# Patient Record
Sex: Male | Born: 1980 | Race: Black or African American | Hispanic: No | Marital: Married | Smoking: Current every day smoker
Health system: Southern US, Community
[De-identification: ages and names within clinical notes are randomized; demographics above are authoritative.]

## PROBLEM LIST (undated history)

## (undated) DIAGNOSIS — B20 Human immunodeficiency virus [HIV] disease: Secondary | ICD-10-CM

## (undated) DIAGNOSIS — E113499 Type 2 diabetes mellitus with severe nonproliferative diabetic retinopathy without macular edema, unspecified eye: Secondary | ICD-10-CM

## (undated) DIAGNOSIS — E785 Hyperlipidemia, unspecified: Secondary | ICD-10-CM

## (undated) DIAGNOSIS — K529 Noninfective gastroenteritis and colitis, unspecified: Secondary | ICD-10-CM

## (undated) DIAGNOSIS — Z21 Asymptomatic human immunodeficiency virus [HIV] infection status: Secondary | ICD-10-CM

## (undated) HISTORY — DX: Type 2 diabetes mellitus with severe nonproliferative diabetic retinopathy without macular edema, unspecified eye: E11.3499

## (undated) HISTORY — PX: TONSILLECTOMY: SUR1361

## (undated) HISTORY — DX: Noninfective gastroenteritis and colitis, unspecified: K52.9

## (undated) HISTORY — DX: Hyperlipidemia, unspecified: E78.5

## (undated) HISTORY — PX: GANGLION CYST EXCISION: SHX1691

---

## 2014-09-29 DIAGNOSIS — Z8371 Family history of colonic polyps: Secondary | ICD-10-CM | POA: Insufficient documentation

## 2014-11-07 DIAGNOSIS — K529 Noninfective gastroenteritis and colitis, unspecified: Secondary | ICD-10-CM | POA: Insufficient documentation

## 2019-07-18 ENCOUNTER — Telehealth: Payer: Self-pay | Admitting: Adult Health

## 2019-07-18 ENCOUNTER — Ambulatory Visit (INDEPENDENT_AMBULATORY_CARE_PROVIDER_SITE_OTHER): Payer: BC Managed Care – PPO | Admitting: Adult Health

## 2019-07-18 ENCOUNTER — Other Ambulatory Visit: Payer: Self-pay

## 2019-07-18 ENCOUNTER — Encounter: Payer: Self-pay | Admitting: Adult Health

## 2019-07-18 VITALS — BP 128/92 | HR 75 | Ht 68.0 in | Wt 165.0 lb

## 2019-07-18 DIAGNOSIS — F329 Major depressive disorder, single episode, unspecified: Secondary | ICD-10-CM | POA: Insufficient documentation

## 2019-07-18 DIAGNOSIS — F429 Obsessive-compulsive disorder, unspecified: Secondary | ICD-10-CM

## 2019-07-18 DIAGNOSIS — F411 Generalized anxiety disorder: Secondary | ICD-10-CM

## 2019-07-18 DIAGNOSIS — F4310 Post-traumatic stress disorder, unspecified: Secondary | ICD-10-CM | POA: Insufficient documentation

## 2019-07-18 DIAGNOSIS — F339 Major depressive disorder, recurrent, unspecified: Secondary | ICD-10-CM | POA: Diagnosis not present

## 2019-07-18 DIAGNOSIS — B20 Human immunodeficiency virus [HIV] disease: Secondary | ICD-10-CM | POA: Insufficient documentation

## 2019-07-18 MED ORDER — ESCITALOPRAM OXALATE 10 MG PO TABS: 10.0000 mg | ORAL_TABLET | Freq: Every day | ORAL | 2 refills | Status: DC

## 2019-07-18 NOTE — Telephone Encounter (Signed)
RX sent to [facility] [address]. Updated preferred pharmacy

## 2019-07-18 NOTE — Telephone Encounter (Signed)
Patient called back to change his pharmacy [facility] [address]. For his lexapro 10 mg

## 2019-07-18 NOTE — Telephone Encounter (Signed)
Pt called to ask [facility] [place] [facility] [place] location. This means change Lexapro Rx to new location also. Gave you the wrong location.

## 2019-07-18 NOTE — Progress Notes (Signed)
[facility] MD/PA/NP Initial Note  07/18/2019 12:04 PM Justin [name]  MRN:  [id number]  Chief Complaint:   HPI:   Referred by Psychologist - Jeanella Anton.   Describes mood today as "ok". Pleasant. Mood symptoms - reports depression, anxiety, and irritability at times. Gets wrapped up in the "what if's". Gets irritated when people are too close to me. Doesn't like getting alerts on phone. More "short tempered". Fiance will say "I don't like your attitude". Not being "nice to mom "sometimes". Stating the biggest thing is "irritaility". Has depressive "episodes". Feels like life is good and going well - "but I feel blah". Feels like a burden at times. Feels like therapy has been helpful. Has done an affirmation box. Has a good life - "fiance, adopting child, nice home". Notes impulsivity at times  - - buying things, increased sexual activity, drinking too much". Trying to get better about avoiding feelings. Stable interest and motivation. Taking medications as prescribed.  Energy levels Active, does not have a regular exercise routine. Works full-time as Therapist, occupational. Is about to be furlowed October first. Has some back up plans.  Enjoys some usual interests and activities. Spending time with fiance - together 4 years - getting married next September. Family lives in [place]. Fiance's mother local.  Appetite adequate. Weight fluctuates - "over eats and then may have one meal a day". Sleeps better some nights than others. Averages 5 to 6 hours. Napping during the day. Mind races. Good sleep hygiene.  Focus and concentration stable - "it's ok". Completing tasks. Managing aspects of household. Is a multi-tasker starting tasks and not finishing things. Eventually does get things done.  Denies SI or HI. Denies AH or VH.  Previous medications: Lexapro, Celexa - SSE - 1 and 1/2 years.   Visit Diagnosis:    ICD-10-CM   1. Obsessive-compulsive disorder, unspecified type  F42.9   2. GAD  (generalized anxiety disorder)  F41.1 escitalopram (LEXAPRO) 10 MG tablet  3. Recurrent major depressive disorder, remission status unspecified (HCC)  F33.9 escitalopram (LEXAPRO) 10 MG tablet    Past Psychiatric History: Suicide attempt 02-01-2010 - living in [place] - overdose on vicodin and Celexa. Had been without ETOH for 30 days that day and had went to a party and drank a lot and smoke weed that may have been "laced" with something. Was in ICU and eventually transferred to Psych unit.   Past Medical History: History reviewed. No pertinent past medical history. History reviewed. No pertinent surgical history.  Family Psychiatric History: Mother depressed x 3 years ago after hs father passed away.   Family History: History reviewed. No pertinent family history.  Social History:  Social History   Socioeconomic History  . Marital status: Unknown    Spouse name: Not on file  . Number of children: Not on file  . Years of education: Not on file  . Highest education level: Not on file  Occupational History  . Not on file  Social Needs  . Financial resource strain: Not on file  . Food insecurity    Worry: Not on file    Inability: Not on file  . Transportation needs    Medical: Not on file    Non-medical: Not on file  Tobacco Use  . Smoking status: Never Smoker  . Smokeless tobacco: Never Used  Substance and Sexual Activity  . Alcohol use: Not on file  . Drug use: Not on file  . Sexual activity: Not on file  Lifestyle  .  Physical activity    Days per week: Not on file    Minutes per session: Not on file  . Stress: Not on file  Relationships  . Social Musician on phone: Not on file    Gets together: Not on file    Attends religious service: Not on file    Active member of club or organization: Not on file    Attends meetings of clubs or organizations: Not on file    Relationship status: Not on file  Other Topics Concern  . Not on file  Social History  Narrative  . Not on file    Allergies: No Known Allergies  Metabolic Disorder Labs: No results found for: HGBA1C, MPG No results found for: PROLACTIN No results found for: CHOL, TRIG, HDL, CHOLHDL, VLDL, LDLCALC No results found for: TSH  Therapeutic Level Labs: No results found for: LITHIUM No results found for: VALPROATE No components found for:  CBMZ  Current Medications: Current Outpatient Medications  Medication Sig Dispense Refill  . escitalopram (LEXAPRO) 10 MG tablet Take 1 tablet (10 mg total) by mouth daily. 30 tablet 2  . hydrocortisone 2.5 % cream     . neomycin-polymyxin b-dexamethasone (MAXITROL) 3.5-10000-0.1 SUSP     . TRIUMEQ 600-50-300 MG tablet      No current facility-administered medications for this visit.     Medication Side Effects: none  Orders placed this visit:  No orders of the defined types were placed in this encounter.   Psychiatric Specialty Exam:  ROS  Blood pressure (!) 128/92, pulse 75, height 5\' 8"  (1.727 m), weight 165 lb (74.8 kg).Body mass index is 25.09 kg/m.  General Appearance: Neat and Well Groomed  Eye Contact:  Good  Speech:  Normal Rate and Pressured  Volume:  Normal  Mood:  Anxious, Depressed and Irritable  Affect:  Congruent  Thought Process:  Coherent  Orientation:  Full (Time, Place, and Person)  Thought Content: Logical   Suicidal Thoughts:  No  Homicidal Thoughts:  No  Memory:  WNL  Judgement:  Good  Insight:  Good  Psychomotor Activity:  Normal  Concentration:  Concentration: Good  Recall:  Good  Fund of Knowledge: Good  Language: Good  Assets:  Communication Skills Desire for Improvement Financial Resources/Insurance Housing Intimacy Leisure Time Physical Health Resilience Social Support Talents/Skills Transportation Vocational/Educational  ADL's:  Intact  Cognition: WNL  Prognosis:  Good   Screenings: None  Receiving Psychotherapy: Yes   Treatment Plan/Recommendations:   Plan:  1.  Add Lexapro 10mg  daily  RTC 4 weeks  Patient advised to contact office with any questions, adverse effects, or acute worsening in signs and symptoms.     , NP

## 2019-08-15 ENCOUNTER — Ambulatory Visit (INDEPENDENT_AMBULATORY_CARE_PROVIDER_SITE_OTHER): Payer: BC Managed Care – PPO | Admitting: Adult Health

## 2019-08-15 ENCOUNTER — Encounter: Payer: Self-pay | Admitting: Adult Health

## 2019-08-15 ENCOUNTER — Other Ambulatory Visit: Payer: Self-pay

## 2019-08-15 DIAGNOSIS — G4700 Insomnia, unspecified: Secondary | ICD-10-CM

## 2019-08-15 DIAGNOSIS — F339 Major depressive disorder, recurrent, unspecified: Secondary | ICD-10-CM

## 2019-08-15 DIAGNOSIS — F411 Generalized anxiety disorder: Secondary | ICD-10-CM

## 2019-08-15 MED ORDER — ESCITALOPRAM OXALATE 20 MG PO TABS: 20.0000 mg | ORAL_TABLET | Freq: Every day | ORAL | 2 refills | Status: DC

## 2019-08-15 MED ORDER — HYDROXYZINE HCL 25 MG PO TABS: ORAL_TABLET | ORAL | 2 refills | Status: DC

## 2019-08-15 NOTE — Progress Notes (Signed)
[facility] MD/PA/NP Medication Check  08/15/2019 10:41 AM Justin [name]  MRN:  [id number]  Chief Complaint:   HPI:   Referred by Psychologist - Jeanella Anton.   Describes mood today as "ok". Pleasant. Mood symptoms - reports decreased depression, anxiety, and irritability at times. Not "ruminating as much". More irritable "overall". Has started reading a new book "The subtle art of not giving a "F" - is working on "letting go of things that are not important". Mother with recent fall - "has been worried about her". Got a call from [facility] - "can't go through with adoption now". He and fiance doing well. Stable interest and motivation. Taking medications as prescribed.  Energy levels stable. Active, does not have a regular exercise routine. 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 Enjoys some usual interests and activities. Spending time with fiance. Family lives in [place] - recently visited mother. Fiance's mother local.  Appetite adequate. Weight fluctuates. Having difficulties with sleep. Averages 5 to 6 hours. Not able to get to sleep. Lying in bed "thinking" - not able to shut mind off.  Focus and concentration stable. Completing tasks. Managing aspects of household.  Denies SI or HI. Denies AH or VH.  Previous medications: Lexapro, Celexa - SSE - 1 and 1/2 years.   Visit Diagnosis:    ICD-10-CM   1. GAD (generalized anxiety disorder)  F41.1 hydrOXYzine (ATARAX/VISTARIL) 25 MG tablet    escitalopram (LEXAPRO) 20 MG tablet  2. Recurrent major depressive disorder, remission status unspecified (HCC)  F33.9 escitalopram (LEXAPRO) 20 MG tablet  3. Insomnia, unspecified type  G47.00 hydrOXYzine (ATARAX/VISTARIL) 25 MG tablet    Past Psychiatric History: Suicide attempt 02-01-2010 - living in [place] - overdose on vicodin and Celexa. Had been without ETOH for 30 days that day and had went to a party and drank a lot and smoke weed that may have been "laced"  with something. Was in ICU and eventually transferred to Psych unit.   Past Medical History: No past medical history on file. No past surgical history on file.  Family Psychiatric History: Mother depressed x 3 years ago after hs father passed away.   Family History: No family history on file.  Social History:  Social History   Socioeconomic History  . Marital status: Unknown    Spouse name: Not on file  . Number of children: Not on file  . Years of education: Not on file  . Highest education level: Not on file  Occupational History  . Not on file  Social Needs  . Financial resource strain: Not on file  . Food insecurity    Worry: Not on file    Inability: Not on file  . Transportation needs    Medical: Not on file    Non-medical: Not on file  Tobacco Use  . Smoking status: Never Smoker  . Smokeless tobacco: Never Used  Substance and Sexual Activity  . Alcohol use: Not on file  . Drug use: Not on file  . Sexual activity: Not on file  Lifestyle  . Physical activity    Days per week: Not on file    Minutes per session: Not on file  . Stress: Not on file  Relationships  . Social Herbalist on phone: Not on file    Gets together: Not on file    Attends religious service: Not on file    Active member of club or organization: Not on file  Attends meetings of clubs or organizations: Not on file    Relationship status: Not on file  Other Topics Concern  . Not on file  Social History Narrative  . Not on file    Allergies: No Known Allergies  Metabolic Disorder Labs: No results found for: HGBA1C, MPG No results found for: PROLACTIN No results found for: CHOL, TRIG, HDL, CHOLHDL, VLDL, LDLCALC No results found for: TSH  Therapeutic Level Labs: No results found for: LITHIUM No results found for: VALPROATE No components found for:  CBMZ  Current Medications: Current Outpatient Medications  Medication Sig Dispense Refill  . escitalopram (LEXAPRO) 20  MG tablet Take 1 tablet (20 mg total) by mouth daily. 30 tablet 2  . hydrocortisone 2.5 % cream     . hydrOXYzine (ATARAX/VISTARIL) 25 MG tablet Take 1/2 to one tablet twice during the day, and 1 to 2 tabs at hs as needed for sleep. 120 tablet 2  . neomycin-polymyxin b-dexamethasone (MAXITROL) 3.5-10000-0.1 SUSP     . TRIUMEQ 600-50-300 MG tablet      No current facility-administered medications for this visit.     Medication Side Effects: none  Orders placed this visit:  No orders of the defined types were placed in this encounter.   Psychiatric Specialty Exam:  Review of Systems  Neurological: Negative for tremors and weakness.    There were no vitals taken for this visit.There is no height or weight on file to calculate BMI.  General Appearance: Neat and Well Groomed  Eye Contact:  Good  Speech:  Normal Rate and Pressured  Volume:  Normal  Mood:  Anxious, Depressed and Irritable  Affect:  Congruent  Thought Process:  Coherent  Orientation:  Full (Time, Place, and Person)  Thought Content: Logical   Suicidal Thoughts:  No  Homicidal Thoughts:  No  Memory:  WNL  Judgement:  Good  Insight:  Good  Psychomotor Activity:  Normal  Concentration:  Concentration: Good  Recall:  Good  Fund of Knowledge: Good  Language: Good  Assets:  Communication Skills Desire for Improvement Financial Resources/Insurance Housing Intimacy Leisure Time Physical Health Resilience Social Support Talents/Skills Transportation Vocational/Educational  ADL's:  Intact  Cognition: WNL  Prognosis:  Good   Screenings: None  Receiving Psychotherapy: Yes   Treatment Plan/Recommendations:   Plan:  1. Increase Lexapro 10mg  daily 2. Add Vistrail 25mg  tablet - 1 to 2 during the day and 1 to 2 at bedtime.   RTC 4 weeks  Patient advised to contact office with any questions, adverse effects, or acute worsening in signs and symptoms.     , NP

## 2019-09-12 ENCOUNTER — Ambulatory Visit (INDEPENDENT_AMBULATORY_CARE_PROVIDER_SITE_OTHER): Payer: BC Managed Care – PPO | Admitting: Adult Health

## 2019-09-12 ENCOUNTER — Encounter: Payer: Self-pay | Admitting: Adult Health

## 2019-09-12 ENCOUNTER — Other Ambulatory Visit: Payer: Self-pay

## 2019-09-12 DIAGNOSIS — F411 Generalized anxiety disorder: Secondary | ICD-10-CM | POA: Diagnosis not present

## 2019-09-12 DIAGNOSIS — G4700 Insomnia, unspecified: Secondary | ICD-10-CM

## 2019-09-12 DIAGNOSIS — F339 Major depressive disorder, recurrent, unspecified: Secondary | ICD-10-CM | POA: Diagnosis not present

## 2019-09-12 DIAGNOSIS — F429 Obsessive-compulsive disorder, unspecified: Secondary | ICD-10-CM | POA: Diagnosis not present

## 2019-09-12 MED ORDER — ESCITALOPRAM OXALATE 20 MG PO TABS: 20.0000 mg | ORAL_TABLET | Freq: Every day | ORAL | 2 refills | Status: DC

## 2019-09-12 NOTE — Progress Notes (Addendum)
[facility] MD/PA/NP Medication Check  09/12/2019 10:30 AM Justin [name]  MRN:  [id number]   Virtual Visit via Telephone Note  I connected with pt on 09/12/19 at  10:00 AM EDT by telephone and verified that I am speaking with the correct person using two identifiers.   I discussed the limitations, risks, security and privacy concerns of performing an evaluation and management service by telephone and the availability of in person appointments. I also discussed with the patient that there may be a patient responsible charge related to this service. The patient expressed understanding and agreed to proceed.   I discussed the assessment and treatment plan with the patient. The patient was provided an opportunity to ask questions and all were answered. The patient agreed with the plan and demonstrated an understanding of the instructions.   The patient was advised to call back or seek an in-person evaluation if the symptoms worsen or if the condition fails to improve as anticipated.  I provided 30 minutes of non-face-to-face time during this encounter.  The patient was located at home.  The provider was located at [place].  Chief Complaint:   HPI:   Referred by Psychologist - Jeanella Anton.   Describes mood today as "ok". Pleasant. Mood symptoms - depression, anxiety, and irritability. Decreased worry and rumination. Is able to let things go easier. He and fiance doing well. Mother recovering well. Stable interest and motivation. Taking medications as prescribed. Stable interest and motivation. Taking medications as prescribed.  Energy levels stable. Active, does not have a regular exercise routine. Starting a new job with [employer]. Will be working from home initially.  Enjoys some usual interests and activities. Engaged. Lives with fiance. Will be local for Thanksgiving (fiance family local) - [place]. Visiting mother in [place].  Appetite adequate. Weight  fluctuates. Sleep has improved. Averages 6 to 8 hours.  Focus and concentration stable. Completing tasks. Managing aspects of household.  Denies SI or HI. Denies AH or VH.  Previous medications: Lexapro, Celexa - SSE - 1 and 1/2 years.   Visit Diagnosis:    ICD-10-CM   1. Obsessive-compulsive disorder, unspecified type  F42.9   2. Insomnia, unspecified type  G47.00   3. Recurrent major depressive disorder, remission status unspecified ([facility])  F33.9   4. GAD (generalized anxiety disorder)  F41.1     Past Psychiatric History: Suicide attempt 02-01-2010 - living in [place] - overdose on vicodin and Celexa. Had been without ETOH for 30 days that day and had went to a party and drank a lot and smoke weed that may have been "laced" with something. Was in ICU and eventually transferred to Psych unit.   Past Medical History: No past medical history on file. No past surgical history on file.  Family Psychiatric History: Mother depressed x 3 years ago after hs father passed away.   Family History: No family history on file.  Social History:  Social History   Socioeconomic History  . Marital status: Unknown    Spouse name: Not on file  . Number of children: Not on file  . Years of education: Not on file  . Highest education level: Not on file  Occupational History  . Not on file  Social Needs  . Financial resource strain: Not on file  . Food insecurity    Worry: Not on file    Inability: Not on file  . Transportation needs    Medical: Not on file    Non-medical: Not on  file  Tobacco Use  . Smoking status: Never Smoker  . Smokeless tobacco: Never Used  Substance and Sexual Activity  . Alcohol use: Not on file  . Drug use: Not on file  . Sexual activity: Not on file  Lifestyle  . Physical activity    Days per week: Not on file    Minutes per session: Not on file  . Stress: Not on file  Relationships  . Social Musician on phone: Not on file    Gets together:  Not on file    Attends religious service: Not on file    Active member of club or organization: Not on file    Attends meetings of clubs or organizations: Not on file    Relationship status: Not on file  Other Topics Concern  . Not on file  Social History Narrative  . Not on file    Allergies: No Known Allergies  Metabolic Disorder Labs: No results found for: HGBA1C, MPG No results found for: PROLACTIN No results found for: CHOL, TRIG, HDL, CHOLHDL, VLDL, LDLCALC No results found for: TSH  Therapeutic Level Labs: No results found for: LITHIUM No results found for: VALPROATE No components found for:  CBMZ  Current Medications: Current Outpatient Medications  Medication Sig Dispense Refill  . escitalopram (LEXAPRO) 20 MG tablet Take 1 tablet (20 mg total) by mouth daily. 30 tablet 2  . hydrocortisone 2.5 % cream     . hydrOXYzine (ATARAX/VISTARIL) 25 MG tablet Take 1/2 to one tablet twice during the day, and 1 to 2 tabs at hs as needed for sleep. 120 tablet 2  . neomycin-polymyxin b-dexamethasone (MAXITROL) 3.5-10000-0.1 SUSP     . TRIUMEQ 600-50-300 MG tablet      No current facility-administered medications for this visit.     Medication Side Effects: none  Orders placed this visit:  No orders of the defined types were placed in this encounter.   Psychiatric Specialty Exam:  Review of Systems  Musculoskeletal: Negative for falls.  Neurological: Negative for tremors and weakness.  Psychiatric/Behavioral: The patient does not have insomnia.   All other systems reviewed and are negative.   There were no vitals taken for this visit.There is no height or weight on file to calculate BMI.  General Appearance: Neat and Well Groomed  Eye Contact:  Good  Speech:  Normal Rate and Pressured  Volume:  Normal  Mood:  Anxious, Depressed and Irritable  Affect:  Congruent  Thought Process:  Coherent and Descriptions of Associations: Intact  Orientation:  Full (Time, Place, and  Person)  Thought Content: Logical   Suicidal Thoughts:  No  Homicidal Thoughts:  No  Memory:  WNL  Judgement:  Good  Insight:  Good  Psychomotor Activity:  Normal  Concentration:  Concentration: Good  Recall:  Good  Fund of Knowledge: Good  Language: Good  Assets:  Communication Skills Desire for Improvement Financial Resources/Insurance Housing Intimacy Leisure Time Physical Health Resilience Social Support Talents/Skills Transportation Vocational/Educational  ADL's:  Intact  Cognition: WNL  Prognosis:  Good   Screenings: None  Receiving Psychotherapy: Yes   Treatment Plan/Recommendations:   Plan:  1. Lexapro 20mg  daily 2. Continue Vistrail 25mg  tablet - 1 to 2 during the day and 1 to 2 at bedtime.   RTC 4 weeks  Patient advised to contact office with any questions, adverse effects, or acute worsening in signs and symptoms.     , NP

## 2019-11-14 ENCOUNTER — Other Ambulatory Visit: Payer: Self-pay

## 2019-11-14 ENCOUNTER — Emergency Department
Admission: EM | Admit: 2019-11-14 | Discharge: 2019-11-14 | Disposition: A | Discharge status: Home / Self Care | Payer: Worker's Compensation | Attending: Student in an Organized Health Care Education/Training Program | Admitting: Student in an Organized Health Care Education/Training Program

## 2019-11-14 ENCOUNTER — Encounter: Payer: Self-pay | Admitting: Emergency Medicine

## 2019-11-14 DIAGNOSIS — S61412A Laceration without foreign body of left hand, initial encounter: Secondary | ICD-10-CM | POA: Diagnosis not present

## 2019-11-14 DIAGNOSIS — Z23 Encounter for immunization: Secondary | ICD-10-CM | POA: Insufficient documentation

## 2019-11-14 DIAGNOSIS — W278XXA Contact with other nonpowered hand tool, initial encounter: Secondary | ICD-10-CM | POA: Diagnosis not present

## 2019-11-14 DIAGNOSIS — Y929 Unspecified place or not applicable: Secondary | ICD-10-CM | POA: Diagnosis not present

## 2019-11-14 DIAGNOSIS — Y9389 Activity, other specified: Secondary | ICD-10-CM | POA: Insufficient documentation

## 2019-11-14 DIAGNOSIS — Y990 Civilian activity done for income or pay: Secondary | ICD-10-CM | POA: Insufficient documentation

## 2019-11-14 HISTORY — DX: Asymptomatic human immunodeficiency virus (hiv) infection status: Z21

## 2019-11-14 HISTORY — DX: Human immunodeficiency virus (HIV) disease: B20

## 2019-11-14 MED ORDER — LIDOCAINE HCL (PF) 1 % IJ SOLN
INTRAMUSCULAR | Status: AC
  Administered 2019-11-14: 5 mL
  Filled 2019-11-14: qty 5

## 2019-11-14 MED ORDER — TETANUS-DIPHTH-ACELL PERTUSSIS 5-2.5-18.5 LF-MCG/0.5 IM SUSP
0.5000 mL | Freq: Once | INTRAMUSCULAR | Status: AC
  Administered 2019-11-14: 07:00:00 0.5 mL via INTRAMUSCULAR
  Filled 2019-11-14: qty 0.5

## 2019-11-14 MED ORDER — LIDOCAINE HCL (PF) 1 % IJ SOLN: 5.0000 mL | Freq: Once | INTRAMUSCULAR | Status: AC

## 2019-11-14 NOTE — ED Triage Notes (Signed)
Patient ambulatory to triage with steady gait, without difficulty or distress noted, mask in place; pt reports employed at [employer]); st while at work PTA was cutting open a box and box cutter slipped causing laceration along left thumb; workers comp profile indicates UDS required

## 2019-11-14 NOTE — ED Notes (Signed)
This tech completed UDS per [employer]. Pts urine specimen was collected and walked to lab by this tech at this time.

## 2019-11-14 NOTE — Discharge Instructions (Signed)
Follow-up with the company nurse, urgent care or return to the emergency department in 10 to 12 days to have sutures removed.  Keep area clean and dry.  Watch for any signs of infection such as pus or redness.  You may take Tylenol as needed for pain.  Clean daily with mild soap and water.  Allow this to dry completely before placing a bandage on it.  Allow area to get lots of air when you are at home.  Cover when you are working.

## 2019-11-14 NOTE — ED Notes (Signed)
See triage note  Presents with laceration to left thumb area  States he was using a box cutter    Slipped  Laceration noted across thumb

## 2019-11-14 NOTE — ED Provider Notes (Signed)
[facility] Emergency Department Provider Note   ____________________________________________   First MD Initiated Contact with Patient 11/14/19 0710     (approximate)  I have reviewed the triage vital signs and the nursing notes.   HISTORY  Chief Complaint Laceration   HPI Justin [name] is a 39 y.o. male presents to the ED with laceration to left hand.  Patient states that he was using a box cutter at work when the laceration occurred.  Patient is uncertain of his last tetanus immunization.  He rates his pain as a 0/10.       Past Medical History:  Diagnosis Date  . HIV (human immunodeficiency virus infection) [facility])     Patient Active Problem List   Diagnosis Date Noted  . HIV disease (HCC) 07/18/2019  . GAD (generalized anxiety disorder) 07/18/2019  . MDD (major depressive disorder) 07/18/2019    Past Surgical History:  Procedure Laterality Date  . GANGLION CYST EXCISION Right   . TONSILLECTOMY      Prior to Admission medications   Medication Sig Start Date End Date Taking? Authorizing Provider  escitalopram (LEXAPRO) 20 MG tablet Take 1 tablet (20 mg total) by mouth daily. 09/12/19   Mozingo, Thereasa Solo, NP  neomycin-polymyxin b-dexamethasone (MAXITROL) 3.5-10000-0.1 SUSP  07/11/19   [provider]  TRIUMEQ 600-50-300 MG tablet  07/11/19   [provider]    Allergies Patient has no known allergies.  No family history on file.  Social History Social History   Tobacco Use  . Smoking status: Never Smoker  . Smokeless tobacco: Never Used  Substance Use Topics  . Alcohol use: Not on file  . Drug use: Not on file    Review of Systems Constitutional: No fever/chills Cardiovascular: Denies chest pain. Respiratory: Denies shortness of breath. Musculoskeletal: Left hand pain. Skin: Left hand laceration. Neurological: Negative for  focal weakness or  numbness. ___________________________________________   PHYSICAL EXAM:  VITAL SIGNS: ED Triage Vitals [11/14/19 0549]  Enc Vitals Group     BP 133/79     Pulse Rate 67     Resp 18     Temp 98.1 F (36.7 C)     Temp Source Oral     SpO2 99 %     Weight 166 lb (75.3 kg)     Height 5\' 8"  (1.727 m)     Head Circumference      Peak Flow      Pain Score 0     Pain Loc      Pain Edu?      Excl. in [place]?     Constitutional: Alert and oriented. Well appearing and in no acute distress. Eyes: Conjunctivae are normal.  Head: Atraumatic. Neck: No stridor.   Cardiovascular: Normal rate, regular rhythm. Grossly normal heart sounds.  Good peripheral circulation. Respiratory: Normal respiratory effort.  No retractions. Lungs CTAB. Musculoskeletal: On examination of the left hand there is no gross deformity however there is a linear laceration on the lateral aspect of the first metacarpal area.  Laceration is superficial and without active bleeding.  Patient is able to flex and extend his thumb completely without any difficulty motor sensory function intact.  Capillary refills less than 3 seconds.  No foreign bodies noted. Neurologic:  Normal speech and language. No gross focal neurologic deficits are appreciated.  Skin:  Skin is warm, dry.  Laceration as noted above. Psychiatric: Mood and affect are normal. Speech and behavior are normal.  ____________________________________________  LABS (all labs ordered are listed, but only abnormal results are displayed)  Labs Reviewed - No data to display   PROCEDURES  Procedure(s) performed (including Critical Care):  Marland KitchenMarland KitchenLaceration Repair  Date/Time: 11/14/2019 7:37 AM Performed by: Johnn Hai, PA-C Authorized by: Johnn Hai, PA-C   Consent:    Consent obtained:  Verbal   Consent given by:  Patient   Risks discussed:  Poor wound healing, pain and infection Anesthesia (see MAR for exact dosages):    Anesthesia method:   Local infiltration   Local anesthetic:  Lidocaine 1% w/o epi Laceration details:    Location:  Hand   Length (cm):  4 Repair type:    Repair type:  Simple Pre-procedure details:    Preparation:  Patient was prepped and draped in usual sterile fashion Exploration:    Hemostasis achieved with:  Direct pressure   Wound exploration: wound explored through full range of motion and entire depth of wound probed and visualized     Contaminated: no   Treatment:    Area cleansed with:  Betadine and saline   Amount of cleaning:  Standard   Irrigation solution:  Sterile saline   Irrigation volume:  20 cc   Irrigation method:  Syringe   Visualized foreign bodies/material removed: no   Skin repair:    Repair method:  Sutures   Suture size:  4-0   Suture material:  Nylon   Suture technique:  Simple interrupted   Number of sutures:  7 Approximation:    Approximation:  Close Post-procedure details:    Dressing:  Non-adherent dressing   Patient tolerance of procedure:  Tolerated well, no immediate complications     ____________________________________________   INITIAL IMPRESSION / ASSESSMENT AND PLAN / ED COURSE  As part of my medical decision making, I reviewed the following data within the electronic MEDICAL RECORD NUMBER Notes from prior ED visits and [place] Controlled Substance Database  39 year old male presents to the ED with laceration to his left hand that occurred while he was at work.  Patient cut himself with a box cutter earlier this morning.  Patient tetanus was updated as he was unsure when the last time he had one.  Patient has range of motion without any restrictions.  Motor sensory function intact.  Laceration was superficial and bleeding was controlled.  He tolerated suturing without any difficulty.  Patient was made aware that the sutures would need to be removed in approximately 10 to 12 days.  He is given instructions on how to take care of this.  Also a note was written for  Workmen's Comp. letting them know that he would also need to keep this clean and dry.  ____________________________________________   FINAL CLINICAL IMPRESSION(S) / ED DIAGNOSES  Final diagnoses:  Laceration of left hand without foreign body, initial encounter     ED Discharge Orders    None       Note:  This document was prepared using Dragon voice recognition software and may include unintentional dictation errors.    Johnn Hai, PA-C 11/14/19 1117    Merlyn Lot, MD 11/14/19 1218

## 2019-11-27 ENCOUNTER — Other Ambulatory Visit: Payer: Self-pay

## 2019-11-27 ENCOUNTER — Emergency Department
Admission: EM | Admit: 2019-11-27 | Discharge: 2019-11-27 | Disposition: A | Discharge status: Home / Self Care | Payer: Worker's Compensation | Attending: Emergency Medicine | Admitting: Emergency Medicine

## 2019-11-27 ENCOUNTER — Encounter: Payer: Self-pay | Admitting: Emergency Medicine

## 2019-11-27 ENCOUNTER — Other Ambulatory Visit: Payer: Self-pay | Admitting: Adult Health

## 2019-11-27 DIAGNOSIS — G4700 Insomnia, unspecified: Secondary | ICD-10-CM

## 2019-11-27 DIAGNOSIS — Z4802 Encounter for removal of sutures: Secondary | ICD-10-CM | POA: Diagnosis present

## 2019-11-27 DIAGNOSIS — B20 Human immunodeficiency virus [HIV] disease: Secondary | ICD-10-CM | POA: Diagnosis not present

## 2019-11-27 DIAGNOSIS — S61412D Laceration without foreign body of left hand, subsequent encounter: Secondary | ICD-10-CM | POA: Insufficient documentation

## 2019-11-27 DIAGNOSIS — F411 Generalized anxiety disorder: Secondary | ICD-10-CM

## 2019-11-27 DIAGNOSIS — W268XXD Contact with other sharp object(s), not elsewhere classified, subsequent encounter: Secondary | ICD-10-CM | POA: Diagnosis not present

## 2019-11-27 DIAGNOSIS — Z79899 Other long term (current) drug therapy: Secondary | ICD-10-CM | POA: Insufficient documentation

## 2019-11-27 NOTE — ED Provider Notes (Signed)
[facility] Emergency Department Provider Note  ____________________________________________   First MD Initiated Contact with Patient 11/27/19 1724     (approximate)  I have reviewed the triage vital signs and the nursing notes.   HISTORY  Chief Complaint Suture / Staple Removal   HPI Justin [name] is a 39 y.o. male presents to have sutures removed.  Patient was seen in the ED for laceration to his left hand when he was using a box cutter at work on 11/14/2019.  Patient denies any problems, drainage or redness.     Past Medical History:  Diagnosis Date  . HIV (human immunodeficiency virus infection) [facility])     Patient Active Problem List   Diagnosis Date Noted  . HIV disease ([place]) 07/18/2019  . GAD (generalized anxiety disorder) 07/18/2019  . MDD (major depressive disorder) 07/18/2019    Past Surgical History:  Procedure Laterality Date  . GANGLION CYST EXCISION Right   . TONSILLECTOMY      Prior to Admission medications   Medication Sig Start Date End Date Taking? Authorizing Provider  escitalopram (LEXAPRO) 20 MG tablet Take 1 tablet (20 mg total) by mouth daily. 09/12/19   Mozingo, Berdie Ogren, NP  neomycin-polymyxin b-dexamethasone (MAXITROL) 3.5-10000-0.1 SUSP  07/11/19   [provider]  TRIUMEQ 960-45-409 MG tablet  07/11/19   [provider]    Allergies Patient has no known allergies.  History reviewed. No pertinent family history.  Social History Social History   Tobacco Use  . Smoking status: Never Smoker  . Smokeless tobacco: Never Used  Substance Use Topics  . Alcohol use: Not on file  . Drug use: Not on file    Review of Systems Constitutional: No fever/chills Cardiovascular: Denies chest pain. Respiratory: Denies shortness of breath. Skin: Healing laceration left hand Neurological: Negative for headaches, focal weakness or numbness. ___________________________________________   PHYSICAL  EXAM:  VITAL SIGNS: ED Triage Vitals  Enc Vitals Group     BP 11/27/19 1737 (!) 155/88     Pulse Rate 11/27/19 1737 66     Resp 11/27/19 1737 16     Temp 11/27/19 1737 98.4 F (36.9 C)     Temp Source 11/27/19 1737 Oral     SpO2 11/27/19 1737 99 %     Weight 11/27/19 1725 166 lb (75.3 kg)     Height 11/27/19 1725 5\' 8"  (1.727 m)     Head Circumference --      Peak Flow --      Pain Score 11/27/19 1725 0     Pain Loc --      Pain Edu? --      Excl. in [place]? --     Constitutional: Alert and oriented. Well appearing and in no acute distress. Eyes: Conjunctivae are normal.  Head: Atraumatic. Neck: No stridor.   Musculoskeletal: Left hand patient is able to move without any difficulty and is able to flex and extend all digits without any difficulty. Neurologic:  Normal speech and language. No gross focal neurologic deficits are appreciated. No gait instability. Skin:  Skin is warm, dry and intact.  Sutured area is healed without any signs of infection. Psychiatric: Mood and affect are normal. Speech and behavior are normal.  ____________________________________________   LABS (all labs ordered are listed, but only abnormal results are displayed)  Labs Reviewed - No data to display  PROCEDURES  Procedure(s) performed (including Critical Care):  Procedures   ____________________________________________   INITIAL IMPRESSION / ASSESSMENT AND  PLAN / ED COURSE  As part of my medical decision making, I reviewed the following data within the electronic MEDICAL RECORD NUMBER Notes from prior ED visits and [place] Controlled Substance Database  39 year old male presents to the ED for suture removal from his left hand.  Patient cut himself with a box cutter on 11/14/2019.  He denies any problems and sutures were removed without any difficulty.  Patient is to continue cleaning the area with mild soap and water rinse return to the emergency department if  needed.  ____________________________________________   FINAL CLINICAL IMPRESSION(S) / ED DIAGNOSES  Final diagnoses:  Encounter for removal of sutures     ED Discharge Orders    None       Note:  This document was prepared using Dragon voice recognition software and may include unintentional dictation errors.    Tommi Rumps, PA-C 11/27/19 1803    Emily Filbert, MD 11/27/19 2154

## 2019-11-27 NOTE — Discharge Instructions (Addendum)
Continue to keep your hand clean and dry and watch for any signs of infection however it appears that the wound has healed without any signs of infection and you should not have any difficulties.

## 2019-11-27 NOTE — ED Notes (Signed)
Patient here for suture removal.

## 2019-11-27 NOTE — ED Triage Notes (Signed)
Here for suture removal.  No problems or sx infection.

## 2019-12-02 ENCOUNTER — Ambulatory Visit: Payer: BC Managed Care – PPO | Admitting: Adult Health

## 2019-12-23 ENCOUNTER — Encounter: Payer: Self-pay | Admitting: Adult Health

## 2019-12-23 ENCOUNTER — Ambulatory Visit (INDEPENDENT_AMBULATORY_CARE_PROVIDER_SITE_OTHER): Payer: BC Managed Care – PPO | Admitting: Adult Health

## 2019-12-23 DIAGNOSIS — F339 Major depressive disorder, recurrent, unspecified: Secondary | ICD-10-CM

## 2019-12-23 DIAGNOSIS — F411 Generalized anxiety disorder: Secondary | ICD-10-CM

## 2019-12-23 DIAGNOSIS — F429 Obsessive-compulsive disorder, unspecified: Secondary | ICD-10-CM

## 2019-12-23 DIAGNOSIS — G4700 Insomnia, unspecified: Secondary | ICD-10-CM | POA: Diagnosis not present

## 2019-12-23 MED ORDER — ESCITALOPRAM OXALATE 20 MG PO TABS: 20.0000 mg | ORAL_TABLET | Freq: Every day | ORAL | 1 refills | Status: DC

## 2019-12-23 NOTE — Progress Notes (Signed)
Justin [name] [id number] 01/30/1981 39 y.o.  Virtual Visit via Telephone Note  I connected with pt on 12/23/19 at  1:00 PM EST by telephone and verified that I am speaking with the correct person using two identifiers.   I discussed the limitations, risks, security and privacy concerns of performing an evaluation and management service by telephone and the availability of in person appointments. I also discussed with the patient that there may be a patient responsible charge related to this service. The patient expressed understanding and agreed to proceed.   I discussed the assessment and treatment plan with the patient. The patient was provided an opportunity to ask questions and all were answered. The patient agreed with the plan and demonstrated an understanding of the instructions.   The patient was advised to call back or seek an in-person evaluation if the symptoms worsen or if the condition fails to improve as anticipated.  I provided 30 minutes of non-face-to-face time during this encounter.  The patient was located at home.  The provider was located at [facility].   Dorothyann Gibbs, NP   Subjective:   Patient ID:  Justin [name] is a 39 y.o. (DOB 20-Feb-1981) male.  Chief Complaint:  Chief Complaint  Patient presents with  . Anxiety  . Depression  . Insomnia  . Other    OCD    HPI Justin [name] presents for follow-up of MDD, GAD, OCD, and insomnia   Referred by Psychologist - Norville Haggard.   Describes mood today as "ok". Pleasant. Mood symptoms - reports some depression, anxiety, and irritability. Having mostly good days". Concerned about recent exposure to Covid from a co-worker. Working 2 jobs until he can return to job at [employer]. He and fiance doing well - fiance working from home. Fiance "supportive". Mother "doing pretty good". Stable interest and motivation. Taking medications as prescribed.  Energy levels stable. Active, does not  have a regular exercise routine. Working 2 full times jobs - [employer].  Enjoys some usual interests and activities. Engaged. Lives with fiance. Visiting mother in [place].  Appetite adequate. Weight fluctuates - 175. Sleep has improved. Averages 6 to 8 hours.  Focus and concentration stable. Completing tasks. Managing aspects of household. Work going well. Denies SI or HI. Denies AH or VH.  Previous medications: Lexapro, Celexa - SSE - 1 and 1/2 years.   Review of Systems:  Review of Systems  Musculoskeletal: Negative for gait problem.  Neurological: Negative for tremors.  Psychiatric/Behavioral:       Please refer to HPI    Medications: I have reviewed the patient's current medications.  Current Outpatient Medications  Medication Sig Dispense Refill  . escitalopram (LEXAPRO) 20 MG tablet Take 1 tablet (20 mg total) by mouth daily. 90 tablet 1  . neomycin-polymyxin b-dexamethasone (MAXITROL) 3.5-10000-0.1 SUSP     . TRIUMEQ 600-50-300 MG tablet      No current facility-administered medications for this visit.    Medication Side Effects: None  Allergies: No Known Allergies  Past Medical History:  Diagnosis Date  . HIV (human immunodeficiency virus infection) (HCC)     No family history on file.  Social History   Socioeconomic History  . Marital status: Married    Spouse name: Not on file  . Number of children: Not on file  . Years of education: Not on file  . Highest education level: Not on file  Occupational History  . Not on file  Tobacco Use  . Smoking  status: Never Smoker  . Smokeless tobacco: Never Used  Substance and Sexual Activity  . Alcohol use: Not on file  . Drug use: Not on file  . Sexual activity: Not on file  Other Topics Concern  . Not on file  Social History Narrative  . Not on file   Social Determinants of Health   Financial Resource Strain:   . Difficulty of Paying Living Expenses: Not on file  Food Insecurity:   .  Worried About Charity fundraiser in the Last Year: Not on file  . Ran Out of Food in the Last Year: Not on file  Transportation Needs:   . Lack of Transportation (Medical): Not on file  . Lack of Transportation (Non-Medical): Not on file  Physical Activity:   . Days of Exercise per Week: Not on file  . Minutes of Exercise per Session: Not on file  Stress:   . Feeling of Stress : Not on file  Social Connections:   . Frequency of Communication with Friends and Family: Not on file  . Frequency of Social Gatherings with Friends and Family: Not on file  . Attends Religious Services: Not on file  . Active Member of Clubs or Organizations: Not on file  . Attends Archivist Meetings: Not on file  . Marital Status: Not on file  Intimate Partner Violence:   . Fear of Current or Ex-Partner: Not on file  . Emotionally Abused: Not on file  . Physically Abused: Not on file  . Sexually Abused: Not on file    Past Medical History, Surgical history, Social history, and Family history were reviewed and updated as appropriate.   Please see review of systems for further details on the patient's review from today.   Objective:   Physical Exam:  There were no vitals taken for this visit.  Physical Exam Constitutional:      General: He is not in acute distress. Musculoskeletal:        General: No deformity.  Neurological:     Mental Status: He is alert and oriented to person, place, and time.     Coordination: Coordination normal.  Psychiatric:        Attention and Perception: Attention and perception normal. He does not perceive auditory or visual hallucinations.        Mood and Affect: Mood normal. Mood is not anxious or depressed. Affect is not labile, blunt, angry or inappropriate.        Speech: Speech normal.        Behavior: Behavior normal.        Thought Content: Thought content normal. Thought content is not paranoid or delusional. Thought content does not include  homicidal or suicidal ideation. Thought content does not include homicidal or suicidal plan.        Cognition and Memory: Cognition and memory normal.        Judgment: Judgment normal.     Comments: Insight intact     Lab Review:  No results found for: NA, K, CL, CO2, GLUCOSE, BUN, CREATININE, CALCIUM, PROT, ALBUMIN, AST, ALT, ALKPHOS, BILITOT, GFRNONAA, GFRAA  No results found for: WBC, RBC, HGB, HCT, PLT, MCV, MCH, MCHC, RDW, LYMPHSABS, MONOABS, EOSABS, BASOSABS  No results found for: POCLITH, LITHIUM   No results found for: PHENYTOIN, PHENOBARB, VALPROATE, CBMZ   .res Assessment: Plan:    Plan:  1. Lexapro 20mg  daily 2. Vistrail 25mg  tablet - uses prn  RTC 6 months  Patient advised to  contact office with any questions, adverse effects, or acute worsening in signs and symptoms.   Karell was seen today for anxiety, depression, insomnia and other.  Diagnoses and all orders for this visit:  GAD (generalized anxiety disorder) -     escitalopram (LEXAPRO) 20 MG tablet; Take 1 tablet (20 mg total) by mouth daily.  Insomnia, unspecified type  Obsessive-compulsive disorder, unspecified type  Recurrent major depressive disorder, remission status unspecified (HCC) -     escitalopram (LEXAPRO) 20 MG tablet; Take 1 tablet (20 mg total) by mouth daily.    Please see After Visit Summary for patient specific instructions.  No future appointments.  No orders of the defined types were placed in this encounter.     -------------------------------

## 2020-02-03 ENCOUNTER — Other Ambulatory Visit: Payer: Self-pay

## 2020-02-03 DIAGNOSIS — F411 Generalized anxiety disorder: Secondary | ICD-10-CM

## 2020-02-03 DIAGNOSIS — F339 Major depressive disorder, recurrent, unspecified: Secondary | ICD-10-CM

## 2020-02-03 MED ORDER — ESCITALOPRAM OXALATE 20 MG PO TABS: 20.0000 mg | ORAL_TABLET | Freq: Every day | ORAL | 1 refills | Status: DC

## 2020-04-11 ENCOUNTER — Encounter: Payer: Self-pay | Admitting: Emergency Medicine

## 2020-04-11 ENCOUNTER — Ambulatory Visit
Admission: EM | Admit: 2020-04-11 | Discharge: 2020-04-11 | Disposition: A | Discharge status: Home / Self Care | Payer: BC Managed Care – PPO | Attending: Emergency Medicine | Admitting: Emergency Medicine

## 2020-04-11 ENCOUNTER — Other Ambulatory Visit: Payer: Self-pay

## 2020-04-11 ENCOUNTER — Ambulatory Visit (INDEPENDENT_AMBULATORY_CARE_PROVIDER_SITE_OTHER): Payer: BC Managed Care – PPO

## 2020-04-11 DIAGNOSIS — B20 Human immunodeficiency virus [HIV] disease: Secondary | ICD-10-CM

## 2020-04-11 DIAGNOSIS — Z113 Encounter for screening for infections with a predominantly sexual mode of transmission: Secondary | ICD-10-CM | POA: Diagnosis not present

## 2020-04-11 DIAGNOSIS — R0902 Hypoxemia: Secondary | ICD-10-CM

## 2020-04-11 LAB — POC SARS CORONAVIRUS 2 AG -  ED: SARS Coronavirus 2 Ag: NEGATIVE

## 2020-04-11 MED ORDER — AEROCHAMBER PLUS FLO-VU MEDIUM MISC: 1.0000 | Freq: Once | 0 refills | Status: AC

## 2020-04-11 MED ORDER — CEFTRIAXONE SODIUM 500 MG IJ SOLR
500.0000 mg | Freq: Once | INTRAMUSCULAR | Status: AC
  Administered 2020-04-11: 500 mg via INTRAMUSCULAR

## 2020-04-11 MED ORDER — BENZONATATE 100 MG PO CAPS: 100.0000 mg | ORAL_CAPSULE | Freq: Three times a day (TID) | ORAL | 0 refills | Status: DC

## 2020-04-11 MED ORDER — LIDOCAINE VISCOUS HCL 2 % MT SOLN: 15.0000 mL | OROMUCOSAL | 0 refills | Status: DC | PRN

## 2020-04-11 MED ORDER — ALBUTEROL SULFATE HFA 108 (90 BASE) MCG/ACT IN AERS: 2.0000 | INHALATION_SPRAY | RESPIRATORY_TRACT | 0 refills | Status: DC | PRN

## 2020-04-11 NOTE — ED Provider Notes (Signed)
[facility]    CSN: [id number] Arrival date & time: 04/11/20  1021      History   Chief Complaint Chief Complaint  Patient presents with  . Exposure to STD    HPI Justin [name] is a 39 y.o. male with history of HIV presenting for STI testing and treatment.  States he was notified Thursday that his partner tested positive for gonorrhea.  Patient noting mouth ulcers.  Denies penile or testicular pain, discharge, urinary symptoms, change in bowel habit.  No abdominal pain, back pain, fever.  Does admit to generalized malaise since Thursday evening.  No known sick contacts.  Did receive Covid vaccine greater than 1 month ago.   Past Medical History:  Diagnosis Date  . HIV (human immunodeficiency virus infection) [facility])     Patient Active Problem List   Diagnosis Date Noted  . HIV disease (HCC) 07/18/2019  . GAD (generalized anxiety disorder) 07/18/2019  . MDD (major depressive disorder) 07/18/2019    Past Surgical History:  Procedure Laterality Date  . GANGLION CYST EXCISION Right   . TONSILLECTOMY         Home Medications    Prior to Admission medications   Medication Sig Start Date End Date Taking? Authorizing Provider  albuterol (VENTOLIN HFA) 108 (90 Base) MCG/ACT inhaler Inhale 2 puffs into the lungs every 4 (four) hours as needed for wheezing or shortness of breath. 04/11/20   Hall-Potvin, [place], PA-C  benzonatate (TESSALON) 100 MG capsule Take 1 capsule (100 mg total) by mouth every 8 (eight) hours. 04/11/20   Hall-Potvin, [place], PA-C  escitalopram (LEXAPRO) 20 MG tablet Take 1 tablet (20 mg total) by mouth daily. 02/03/20   Mozingo, Thereasa Solo, NP  lidocaine (XYLOCAINE) 2 % solution Use as directed 15 mLs in the mouth or throat as needed for mouth pain. 04/11/20   Hall-Potvin, [place], PA-C  neomycin-polymyxin b-dexamethasone (MAXITROL) 3.5-10000-0.1 SUSP  07/11/19   [provider]  Spacer/Aero-Holding Chambers (AEROCHAMBER PLUS  FLO-VU MEDIUM) MISC 1 each by Other route once for 1 dose. 04/11/20 04/11/20  Hall-Potvin, Arnell Sieving  TRIUMEQ 600-50-300 MG tablet  07/11/19   [provider]    Family History History reviewed. No pertinent family history.  Social History Social History   Tobacco Use  . Smoking status: Never Smoker  . Smokeless tobacco: Never Used  Vaping Use  . Vaping Use: Never used  Substance Use Topics  . Alcohol use: Not Currently  . Drug use: Never     Allergies   Patient has no known allergies.   Review of Systems As per HPI   Physical Exam Triage Vital Signs ED Triage Vitals  Enc Vitals Group     BP 04/11/20 1037 105/68     Pulse Rate 04/11/20 1037 77     Resp 04/11/20 1037 16     Temp 04/11/20 1037 99.6 F (37.6 C)     Temp Source 04/11/20 1037 Oral     SpO2 04/11/20 1037 91 %     Weight 04/11/20 1041 163 lb (73.9 kg)     Height --      Head Circumference --      Peak Flow --      Pain Score 04/11/20 1041 0     Pain Loc --      Pain Edu? --      Excl. in GC? --    No data found.  Updated Vital Signs BP 105/68 (BP Location: Left Arm)  Pulse 77   Temp 99.6 F (37.6 C) (Oral)   Resp 16   Wt 163 lb (73.9 kg)   SpO2 91%   BMI 24.78 kg/m   Visual Acuity Right Eye Distance:   Left Eye Distance:   Bilateral Distance:    Right Eye Near:   Left Eye Near:    Bilateral Near:     Physical Exam Constitutional:      General: He is not in acute distress.    Appearance: He is not toxic-appearing or diaphoretic.  HENT:     Head: Normocephalic and atraumatic.     Mouth/Throat:     Mouth: Mucous membranes are moist.     Pharynx: Oropharynx is clear.  Eyes:     General: No scleral icterus.    Conjunctiva/sclera: Conjunctivae normal.     Pupils: Pupils are equal, round, and reactive to light.  Neck:     Comments: Trachea midline, negative JVD Cardiovascular:     Rate and Rhythm: Normal rate and regular rhythm.     Pulses: Normal pulses.      Heart sounds: Normal heart sounds.  Pulmonary:     Effort: Pulmonary effort is normal. No respiratory distress.     Breath sounds: No wheezing.  Abdominal:     Palpations: Abdomen is soft.     Tenderness: There is no abdominal tenderness.  Genitourinary:    Comments: Deferred, self swab performed Musculoskeletal:        General: Normal range of motion.     Cervical back: Neck supple. No tenderness.     Right lower leg: No edema.     Left lower leg: No edema.  Lymphadenopathy:     Cervical: No cervical adenopathy.  Skin:    General: Skin is warm.     Capillary Refill: Capillary refill takes less than 2 seconds.     Coloration: Skin is not jaundiced or pale.     Findings: No rash.  Neurological:     General: No focal deficit present.     Mental Status: He is alert and oriented to person, place, and time.      UC Treatments / Results  Labs (all labs ordered are listed, but only abnormal results are displayed) Labs Reviewed  POC SARS CORONAVIRUS 2 AG -  ED - Normal  NOVEL CORONAVIRUS, NAA  RPR  CYTOLOGY, (ORAL, ANAL, URETHRAL) ANCILLARY ONLY    EKG   Radiology DG Chest 2 View  Result Date: 04/11/2020 CLINICAL DATA:  Hypoxia.  History of HIV. EXAM: CHEST - 2 VIEW COMPARISON:  None. FINDINGS: Lungs are adequately inflated and otherwise clear. Cardiomediastinal silhouette and remainder of the exam is normal. IMPRESSION: No active cardiopulmonary disease. Electronically Signed   By: Marin Olp M.D.   On: 04/11/2020 11:33    Procedures Procedures (including critical care time)  Medications Ordered in UC Medications  cefTRIAXone (ROCEPHIN) injection 500 mg (500 mg Intramuscular Given 04/11/20 1118)    Initial Impression / Assessment and Plan / UC Course  I have reviewed the triage vital signs and the nursing notes.  Pertinent labs & imaging results that were available during my care of the patient were reviewed by me and considered in my medical decision making (see  chart for details).     Patient afebrile, nontoxic, with SpO2 91%.  Appears well-hydrated.  Rapid Covid negative, PCR pending.  Patient to quarantine until results are back.  We will treat supportively as outlined below.  Given exposure to gonorrhea,  patient given ceftriaxone in office which he tolerated well.  Cytology pending: We will treat coinfection if indicated.  Patient has no history of syphilis, last titer 1: 4, though unable to verify this through care everywhere.  Given hypoxia, general malaise, history of HIV chest x-ray was done in office.  This was reviewed by me radiology: Negative for active cardiopulmonary disease.  Reviewed findings with patient verbalized understanding.  Patient denying shortness of breath, chest pain.  Per patient last CD4 count done 6 months ago: 1324, viral load <20.  Unable to access these records through Care Everywhere.  States he has ID follow-up Monday: Intends to keep appointment.  ER return precautions discussed, patient verbalized understanding and is agreeable to plan. Final Clinical Impressions(s) / UC Diagnoses   Final diagnoses:  Screening examination for venereal disease  Hypoxia     Discharge Instructions     Today you received treatment for gonorrhea. Testing for chlamydia, gonorrhea, trichomonas, syphilis is pending: please look for these results on the MyChart app/website.  We will notify you if you are positive and outline treatment at that time.  Important to avoid all forms of sexual intercourse (oral, vaginal, anal) with any/all partners for the next 7 days to avoid spreading/reinfecting. Any/all sexual partners should be notified of testing/treatment today.  Return for persistent/worsening symptoms or if you develop fever, abdominal or pelvic pain, blood in your urine, or are re-exposed to an STI.    ED Prescriptions    Medication Sig Dispense Auth. Provider   lidocaine (XYLOCAINE) 2 % solution Use as directed 15 mLs in the mouth  or throat as needed for mouth pain. 100 mL Hall-Potvin, [place], PA-C   albuterol (VENTOLIN HFA) 108 (90 Base) MCG/ACT inhaler Inhale 2 puffs into the lungs every 4 (four) hours as needed for wheezing or shortness of breath. 18 g Hall-Potvin, [place], PA-C   Spacer/Aero-Holding Chambers (AEROCHAMBER PLUS FLO-VU MEDIUM) MISC 1 each by Other route once for 1 dose. 1 each Hall-Potvin, [place], PA-C   benzonatate (TESSALON) 100 MG capsule Take 1 capsule (100 mg total) by mouth every 8 (eight) hours. 21 capsule Hall-Potvin, [place], PA-C     PDMP not reviewed this encounter.   Hall-Potvin, [place], [place] 04/11/20 1306

## 2020-04-11 NOTE — ED Triage Notes (Signed)
Thursday his partner notified him that he had gonorrhea.  Has had ulcers in mouth since Thursday as well.

## 2020-04-11 NOTE — Discharge Instructions (Signed)
Today you received treatment for gonorrhea. Testing for chlamydia, gonorrhea, trichomonas, syphilis is pending: please look for these results on the MyChart app/website.  We will notify you if you are positive and outline treatment at that time.  Important to avoid all forms of sexual intercourse (oral, vaginal, anal) with any/all partners for the next 7 days to avoid spreading/reinfecting. Any/all sexual partners should be notified of testing/treatment today.  Return for persistent/worsening symptoms or if you develop fever, abdominal or pelvic pain, blood in your urine, or are re-exposed to an STI.

## 2020-04-12 LAB — SARS-COV-2, NAA 2 DAY TAT

## 2020-04-12 LAB — NOVEL CORONAVIRUS, NAA: SARS-CoV-2, NAA: NOT DETECTED

## 2020-04-14 LAB — CYTOLOGY, (ORAL, ANAL, URETHRAL) ANCILLARY ONLY
Chlamydia: NEGATIVE
Comment: NEGATIVE
Comment: NEGATIVE
Comment: NORMAL
Neisseria Gonorrhea: NEGATIVE
Trichomonas: NEGATIVE

## 2020-04-14 LAB — RPR, QUANT+TP ABS (REFLEX)
Rapid Plasma Reagin, Quant: 1:4 {titer} — ABNORMAL HIGH
T Pallidum Abs: REACTIVE — AB

## 2020-04-14 LAB — RPR: RPR Ser Ql: REACTIVE — AB

## 2020-06-04 ENCOUNTER — Other Ambulatory Visit: Payer: Self-pay

## 2020-06-04 ENCOUNTER — Ambulatory Visit (INDEPENDENT_AMBULATORY_CARE_PROVIDER_SITE_OTHER): Payer: BC Managed Care – PPO | Admitting: Adult Health

## 2020-06-04 ENCOUNTER — Encounter: Payer: Self-pay | Admitting: Adult Health

## 2020-06-04 DIAGNOSIS — F411 Generalized anxiety disorder: Secondary | ICD-10-CM

## 2020-06-04 DIAGNOSIS — F339 Major depressive disorder, recurrent, unspecified: Secondary | ICD-10-CM | POA: Diagnosis not present

## 2020-06-04 DIAGNOSIS — G4700 Insomnia, unspecified: Secondary | ICD-10-CM | POA: Diagnosis not present

## 2020-06-04 DIAGNOSIS — F429 Obsessive-compulsive disorder, unspecified: Secondary | ICD-10-CM | POA: Diagnosis not present

## 2020-06-04 MED ORDER — ESCITALOPRAM OXALATE 20 MG PO TABS: 20.0000 mg | ORAL_TABLET | Freq: Every day | ORAL | 1 refills | Status: DC

## 2020-06-04 NOTE — Progress Notes (Signed)
Justin [name] [id number] 21-Aug-1981 39 y.o.  Subjective:   Patient ID:  Justin [name] is a 39 y.o. (DOB [birth year]) male.  Chief Complaint: No chief complaint on file.   HPI Justin [name] presents to the office today for follow-up of  MDD, GAD, OCD, and insomnia  Describes mood today as "ok". Pleasant. Mood symptoms - reports some anxiety and irritability. Denies depression. Decreased worry and rumination. Stating "I'm doing pretty good for the most part". Finalizing upcoming marriage plans - getting [place] in the [place]. Has decided not to continue adoption process. Went back to work in April - [employer] - "very stressful". Mother attending wedding - [place]. Stable interest and motivation. Taking medications as prescribed.  Energy levels stable. Active, does not have a regular exercise routine.  Enjoys some usual interests and activities. Engaged. Lives with fiance. Mother doing well. Spending time with family. Appetite adequate. Weight fluctuates - 163 pounds. Sleeping well. Averages 6 to 8 hours.  Focus and concentration stable. Completing tasks. Managing aspects of household. Work going well. Denies SI or HI. Denies AH or VH.  Previous medications: Lexapro, Celexa - SSE - 1 and 1/2 years.    Review of Systems:  Review of Systems  Musculoskeletal: Negative for gait problem.  Neurological: Negative for tremors.  Psychiatric/Behavioral:       Please refer to HPI    Medications: I have reviewed the patient's current medications.  Current Outpatient Medications  Medication Sig Dispense Refill   albuterol (VENTOLIN HFA) 108 (90 Base) MCG/ACT inhaler Inhale 2 puffs into the lungs every 4 (four) hours as needed for wheezing or shortness of breath. 18 g 0   benzonatate (TESSALON) 100 MG capsule Take 1 capsule (100 mg total) by mouth every 8 (eight) hours. 21 capsule 0   escitalopram (LEXAPRO) 20 MG tablet Take 1 tablet (20 mg total) by mouth daily. 90 tablet 1    lidocaine (XYLOCAINE) 2 % solution Use as directed 15 mLs in the mouth or throat as needed for mouth pain. 100 mL 0   neomycin-polymyxin b-dexamethasone (MAXITROL) 3.5-10000-0.1 SUSP      TRIUMEQ 600-50-300 MG tablet      No current facility-administered medications for this visit.    Medication Side Effects: None  Allergies: No Known Allergies  Past Medical History:  Diagnosis Date   HIV (human immunodeficiency virus infection) (HCC)     No family history on file.  Social History   Socioeconomic History   Marital status: Married    Spouse name: Not on file   Number of children: Not on file   Years of education: Not on file   Highest education level: Not on file  Occupational History   Not on file  Tobacco Use   Smoking status: Never Smoker   Smokeless tobacco: Never Used  Vaping Use   Vaping Use: Never used  Substance and Sexual Activity   Alcohol use: Not Currently   Drug use: Never   Sexual activity: Yes  Other Topics Concern   Not on file  Social History Narrative   Not on file   Social Determinants of Health   Financial Resource Strain:    Difficulty of Paying Living Expenses:   Food Insecurity:    Worried About Programme researcher, broadcasting/film/video in the Last Year:    Barista in the Last Year:   Transportation Needs:    Lack of Transportation (Medical):    Lack of Transportation (Non-Medical):   Physical Activity:  Days of Exercise per Week:    Minutes of Exercise per Session:   Stress:    Feeling of Stress :   Social Connections:    Frequency of Communication with Friends and Family:    Frequency of Social Gatherings with Friends and Family:    Attends Religious Services:    Active Member of Clubs or Organizations:    Attends Engineer, structural:    Marital Status:   Intimate Partner Violence:    Fear of Current or Ex-Partner:    Emotionally Abused:    Physically Abused:    Sexually Abused:     Past  Medical History, Surgical history, Social history, and Family history were reviewed and updated as appropriate.   Please see review of systems for further details on the patient's review from today.   Objective:   Physical Exam:  There were no vitals taken for this visit.  Physical Exam Constitutional:      General: He is not in acute distress. Musculoskeletal:        General: No deformity.  Neurological:     Mental Status: He is alert and oriented to person, place, and time.     Coordination: Coordination normal.  Psychiatric:        Attention and Perception: Attention and perception normal. He does not perceive auditory or visual hallucinations.        Mood and Affect: Mood normal. Mood is not anxious or depressed. Affect is not labile, blunt, angry or inappropriate.        Speech: Speech normal.        Behavior: Behavior normal.        Thought Content: Thought content normal. Thought content is not paranoid or delusional. Thought content does not include homicidal or suicidal ideation. Thought content does not include homicidal or suicidal plan.        Cognition and Memory: Cognition and memory normal.        Judgment: Judgment normal.     Comments: Insight intact     Lab Review:  No results found for: NA, K, CL, CO2, GLUCOSE, BUN, CREATININE, CALCIUM, PROT, ALBUMIN, AST, ALT, ALKPHOS, BILITOT, GFRNONAA, GFRAA  No results found for: WBC, RBC, HGB, HCT, PLT, MCV, MCH, MCHC, RDW, LYMPHSABS, MONOABS, EOSABS, BASOSABS  No results found for: POCLITH, LITHIUM   No results found for: PHENYTOIN, PHENOBARB, VALPROATE, CBMZ   .res Assessment: Plan:    Plan:  1. Lexapro 20mg  daily 2. Vistrail 25mg  tablet - uses prn  Seeing PCP this morning for physical and labs - fatigued and sleeping more  RTC 6 months  Patient advised to contact office with any questions, adverse effects, or acute worsening in signs and symptoms.   Diagnoses and all orders for this visit:  GAD  (generalized anxiety disorder) -     escitalopram (LEXAPRO) 20 MG tablet; Take 1 tablet (20 mg total) by mouth daily.  Insomnia, unspecified type  Obsessive-compulsive disorder, unspecified type  Recurrent major depressive disorder, remission status unspecified (HCC) -     escitalopram (LEXAPRO) 20 MG tablet; Take 1 tablet (20 mg total) by mouth daily.     Please see After Visit Summary for patient specific instructions.  No future appointments.  No orders of the defined types were placed in this encounter.   -------------------------------

## 2020-12-03 ENCOUNTER — Ambulatory Visit: Payer: BC Managed Care – PPO | Admitting: Adult Health

## 2020-12-21 ENCOUNTER — Ambulatory Visit: Payer: BC Managed Care – PPO | Admitting: Adult Health

## 2021-01-26 ENCOUNTER — Ambulatory Visit
Admission: RE | Admit: 2021-01-26 | Discharge: 2021-01-26 | Disposition: A | Discharge status: Home / Self Care | Payer: BC Managed Care – PPO | Source: Ambulatory Visit | Attending: Family Medicine | Admitting: Family Medicine

## 2021-01-26 ENCOUNTER — Other Ambulatory Visit: Payer: Self-pay

## 2021-01-26 VITALS — BP 119/68 | HR 80 | Temp 98.1°F | Resp 18

## 2021-01-26 DIAGNOSIS — Z113 Encounter for screening for infections with a predominantly sexual mode of transmission: Secondary | ICD-10-CM | POA: Insufficient documentation

## 2021-01-26 MED ORDER — AZITHROMYCIN 500 MG PO TABS
1000.0000 mg | ORAL_TABLET | Freq: Once | ORAL | Status: AC
  Administered 2021-01-26: 1000 mg via ORAL

## 2021-01-26 MED ORDER — CEFTRIAXONE SODIUM 500 MG IJ SOLR
500.0000 mg | Freq: Once | INTRAMUSCULAR | Status: AC
  Administered 2021-01-26: 500 mg via INTRAMUSCULAR

## 2021-01-26 NOTE — ED Provider Notes (Signed)
[facility]    CSN: [id number] Arrival date & time: 01/26/21  1406      History   Chief Complaint Chief Complaint  Patient presents with  . Anal Itching    HPI Justin [name] is a 40 y.o. male.   Patient is a 40 year old male presents today for STD screening.  Reporting partner told him that he tested positive for gonorrhea and chlamydia.  He has had mild anal itching for 1 day.  Denies any dysuria, hematuria or penile discharge.  No testicle pain or swelling.  Also would like to be checked for syphilis.  Has tried cortisone cream to rectal area with some relief.     Past Medical History:  Diagnosis Date  . HIV (human immunodeficiency virus infection) [facility])     Patient Active Problem List   Diagnosis Date Noted  . HIV disease (HCC) 07/18/2019  . GAD (generalized anxiety disorder) 07/18/2019  . MDD (major depressive disorder) 07/18/2019    Past Surgical History:  Procedure Laterality Date  . GANGLION CYST EXCISION Right   . TONSILLECTOMY         Home Medications    Prior to Admission medications   Medication Sig Start Date End Date Taking? Authorizing Provider  albuterol (VENTOLIN HFA) 108 (90 Base) MCG/ACT inhaler Inhale 2 puffs into the lungs every 4 (four) hours as needed for wheezing or shortness of breath. 04/11/20   Hall-Potvin, [place], PA-C  benzonatate (TESSALON) 100 MG capsule Take 1 capsule (100 mg total) by mouth every 8 (eight) hours. 04/11/20   Hall-Potvin, [place], PA-C  escitalopram (LEXAPRO) 20 MG tablet Take 1 tablet (20 mg total) by mouth daily. 06/04/20   Mozingo, Thereasa Solo, NP  lidocaine (XYLOCAINE) 2 % solution Use as directed 15 mLs in the mouth or throat as needed for mouth pain. 04/11/20   Hall-Potvin, [place], PA-C  neomycin-polymyxin b-dexamethasone (MAXITROL) 3.5-10000-0.1 SUSP  07/11/19   [provider]  TRIUMEQ 600-50-300 MG tablet  07/11/19   [provider]    Family History History reviewed. No  pertinent family history.  Social History Social History   Tobacco Use  . Smoking status: Never Smoker  . Smokeless tobacco: Never Used  Vaping Use  . Vaping Use: Never used  Substance Use Topics  . Alcohol use: Not Currently  . Drug use: Never     Allergies   Patient has no known allergies.   Review of Systems Review of Systems   Physical Exam Triage Vital Signs ED Triage Vitals  Enc Vitals Group     BP 01/26/21 1424 119/68     Pulse Rate 01/26/21 1424 80     Resp 01/26/21 1424 18     Temp 01/26/21 1424 98.1 F (36.7 C)     Temp Source 01/26/21 1424 Oral     SpO2 01/26/21 1424 95 %     Weight --      Height --      Head Circumference --      Peak Flow --      Pain Score 01/26/21 1427 0     Pain Loc --      Pain Edu? --      Excl. in GC? --    No data found.  Updated Vital Signs BP 119/68 (BP Location: Left Arm)   Pulse 80   Temp 98.1 F (36.7 C) (Oral)   Resp 18   SpO2 95%   Visual Acuity Right Eye Distance:   Left Eye  Distance:   Bilateral Distance:    Right Eye Near:   Left Eye Near:    Bilateral Near:     Physical Exam Vitals and nursing note reviewed.  Constitutional:      Appearance: Normal appearance.  HENT:     Head: Normocephalic and atraumatic.  Eyes:     Conjunctiva/sclera: Conjunctivae normal.  Pulmonary:     Effort: Pulmonary effort is normal.  Musculoskeletal:        General: Normal range of motion.     Cervical back: Normal range of motion.  Skin:    General: Skin is warm and dry.  Neurological:     Mental Status: He is alert.  Psychiatric:        Mood and Affect: Mood normal.      UC Treatments / Results  Labs (all labs ordered are listed, but only abnormal results are displayed) Labs Reviewed  RPR  CYTOLOGY, (ORAL, ANAL, URETHRAL) ANCILLARY ONLY    EKG   Radiology No results found.  Procedures Procedures (including critical care time)  Medications Ordered in UC Medications  cefTRIAXone (ROCEPHIN)  injection 500 mg (500 mg Intramuscular Given 01/26/21 1455)  azithromycin (ZITHROMAX) tablet 1,000 mg (1,000 mg Oral Given 01/26/21 1455)    Initial Impression / Assessment and Plan / UC Course  I have reviewed the triage vital signs and the nursing notes.  Pertinent labs & imaging results that were available during my care of the patient were reviewed by me and considered in my medical decision making (see chart for details).     STD screening Patient opting to go ahead and treat prophylactically for gonorrhea and chlamydia today.  Medication given here in clinic to treat. Swab sent for testing and RPR sent for testing Follow up as needed for continued or worsening symptoms  Final Clinical Impressions(s) / UC Diagnoses   Final diagnoses:  Screening examination for STD (sexually transmitted disease)     Discharge Instructions     Treated you for gonorrhea and chlamydia today. Refrain from sexual activity for at least a week.  You can get your blood work results on my chart.  Follow up as needed for continued or worsening symptoms     ED Prescriptions    None     PDMP not reviewed this encounter.   Janace Aris, NP 01/26/21 1546

## 2021-01-26 NOTE — Discharge Instructions (Addendum)
Treated you for gonorrhea and chlamydia today. Refrain from sexual activity for at least a week.  You can get your blood work results on my chart.  Follow up as needed for continued or worsening symptoms

## 2021-01-26 NOTE — ED Triage Notes (Addendum)
Patient c/o anal itching x 1 day.   Patient endorses his partner contracted gonorrhea and chlamydia.   Patient endorses the last sexual encounter was on Sunday.   Patient endorses some discomfort in the urethral area.   Patient denies dysuria, penile drainage, and ABD pain.   Patient wants a Syphilis test done.   Patient has tried cortisone cream w/ "some" relief of symptoms.

## 2021-01-28 LAB — CYTOLOGY, (ORAL, ANAL, URETHRAL) ANCILLARY ONLY
Chlamydia: NEGATIVE
Comment: NEGATIVE
Comment: NEGATIVE
Comment: NORMAL
Neisseria Gonorrhea: NEGATIVE
Trichomonas: NEGATIVE

## 2021-01-28 LAB — RPR, QUANT+TP ABS (REFLEX)
Rapid Plasma Reagin, Quant: 1:4 {titer} — ABNORMAL HIGH
T Pallidum Abs: REACTIVE — AB

## 2021-01-28 LAB — RPR: RPR Ser Ql: REACTIVE — AB

## 2021-02-17 ENCOUNTER — Ambulatory Visit: Payer: BC Managed Care – PPO | Admitting: Adult Health

## 2021-03-08 ENCOUNTER — Telehealth: Payer: Self-pay | Admitting: Adult Health

## 2021-03-08 NOTE — Telephone Encounter (Signed)
Noted  

## 2021-03-08 NOTE — Telephone Encounter (Signed)
Lior called to inform that he will miss his next visit because he is currently on his first week of substance treatment at the [facility] in [place]. This is a four week treatment. Just to form.

## 2021-03-08 NOTE — Telephone Encounter (Signed)
FYI

## 2021-03-10 ENCOUNTER — Ambulatory Visit: Payer: BC Managed Care – PPO | Admitting: Adult Health

## 2021-04-07 ENCOUNTER — Ambulatory Visit: Payer: BC Managed Care – PPO | Admitting: Adult Health

## 2021-04-07 ENCOUNTER — Other Ambulatory Visit: Payer: Self-pay

## 2021-04-07 ENCOUNTER — Encounter: Payer: Self-pay | Admitting: Adult Health

## 2021-04-07 DIAGNOSIS — F411 Generalized anxiety disorder: Secondary | ICD-10-CM | POA: Diagnosis not present

## 2021-04-07 DIAGNOSIS — G4700 Insomnia, unspecified: Secondary | ICD-10-CM

## 2021-04-07 DIAGNOSIS — F1010 Alcohol abuse, uncomplicated: Secondary | ICD-10-CM | POA: Diagnosis not present

## 2021-04-07 DIAGNOSIS — F4310 Post-traumatic stress disorder, unspecified: Secondary | ICD-10-CM | POA: Diagnosis not present

## 2021-04-07 DIAGNOSIS — F339 Major depressive disorder, recurrent, unspecified: Secondary | ICD-10-CM

## 2021-04-07 MED ORDER — NALTREXONE HCL 50 MG PO TABS: 50.0000 mg | ORAL_TABLET | Freq: Every day | ORAL | 2 refills | Status: DC

## 2021-04-07 MED ORDER — MIRTAZAPINE 30 MG PO TABS: 30.0000 mg | ORAL_TABLET | Freq: Every day | ORAL | 5 refills | Status: DC

## 2021-04-07 MED ORDER — GABAPENTIN 300 MG PO CAPS: 300.0000 mg | ORAL_CAPSULE | Freq: Two times a day (BID) | ORAL | 5 refills | Status: DC

## 2021-04-07 MED ORDER — VENLAFAXINE HCL ER 150 MG PO CP24: 150.0000 mg | ORAL_CAPSULE | Freq: Every day | ORAL | 5 refills | Status: DC

## 2021-04-07 NOTE — Progress Notes (Signed)
Justin [name] [id number] [birth year] 40 y.o.  Subjective:   Patient ID:  Justin [name] is a 40 y.o. (DOB [birth year]) male.  Chief Complaint: No chief complaint on file.   HPI Justin [name] presents to the office today for follow-up of MDD, GAD, OCD, and insomnia  Describes mood today as "ok". Pleasant. Mood symptoms - denies depression, anxiety and irritability. Decreased worry and rumination. Stating "I'm doing much better". Married in September in [place]. He and husband doing well. Reports some struggles since last visit. Reports a recent suicide attempt while on a layover in [place]. Had been anxious about meeting with his supervisor - "I was concerned about my job". Also admitted to having alcohol issues. Admitted to treatment center [facility] in [place]. Currently involved in a virtual IOP program. Celebrating 50 days of sobriety. Feeling the best he's felt since high school.  Stable interest and motivation. Taking medications as prescribed.  Energy levels stable. Active, does not have a regular exercise routine.  Enjoys some usual interests and activities. Married. Lives with husband. Has a new puppy "[place]". Mother doing well. Spending time with family. Appetite adequate. Weight fluctuates - 163 pounds. Sleeping well. Averages 6 to 8 hours.  Focus and concentration stable. Completing tasks. Managing aspects of household. Work going well. Denies SI or HI.  Denies AH or VH.  Previous medications: Lexapro, Celexa - SSE - 1 and 1/2 years.      Flowsheet Row ED from 01/26/2021 in [facility] RISK CATEGORY No Risk        Review of Systems:  Review of Systems  Musculoskeletal:  Negative for gait problem.  Neurological:  Negative for tremors.  Psychiatric/Behavioral:         Please refer to HPI   Medications: I have reviewed the patient's current medications.  Current Outpatient Medications  Medication Sig Dispense Refill    albuterol (VENTOLIN HFA) 108 (90 Base) MCG/ACT inhaler Inhale 2 puffs into the lungs every 4 (four) hours as needed for wheezing or shortness of breath. 18 g 0   gabapentin (NEURONTIN) 300 MG capsule Take by mouth.     hydrOXYzine (ATARAX/VISTARIL) 10 MG tablet Take by mouth.     mirtazapine (REMERON) 15 MG tablet Take by mouth.     naltrexone (DEPADE) 50 MG tablet Take 50 mg by mouth daily.     venlafaxine XR (EFFEXOR-XR) 75 MG 24 hr capsule Take 75 mg by mouth daily.     No current facility-administered medications for this visit.    Medication Side Effects: None  Allergies: No Known Allergies  Past Medical History:  Diagnosis Date   HIV (human immunodeficiency virus infection) (HCC)     Past Medical History, Surgical history, Social history, and Family history were reviewed and updated as appropriate.   Please see review of systems for further details on the patient's review from today.   Objective:   Physical Exam:  There were no vitals taken for this visit.  Physical Exam Constitutional:      General: He is not in acute distress. Musculoskeletal:        General: No deformity.  Neurological:     Mental Status: He is alert and oriented to person, place, and time.     Coordination: Coordination normal.  Psychiatric:        Attention and Perception: Attention and perception normal. He does not perceive auditory or visual hallucinations.        Mood  and Affect: Mood normal. Mood is not anxious or depressed. Affect is not labile, blunt, angry or inappropriate.        Speech: Speech normal.        Behavior: Behavior normal.        Thought Content: Thought content normal. Thought content is not paranoid or delusional. Thought content does not include homicidal or suicidal ideation. Thought content does not include homicidal or suicidal plan.        Cognition and Memory: Cognition and memory normal.        Judgment: Judgment normal.     Comments: Insight intact    Lab  Review:  No results found for: NA, K, CL, CO2, GLUCOSE, BUN, CREATININE, CALCIUM, PROT, ALBUMIN, AST, ALT, ALKPHOS, BILITOT, GFRNONAA, GFRAA  No results found for: WBC, RBC, HGB, HCT, PLT, MCV, MCH, MCHC, RDW, LYMPHSABS, MONOABS, EOSABS, BASOSABS  No results found for: POCLITH, LITHIUM   No results found for: PHENYTOIN, PHENOBARB, VALPROATE, CBMZ   .res Assessment: Plan:    Plan:  1. Gabapentin 300mg  - 2 at hs 2. Remeron 30mg  at bedtime 3. Effexor XR 150mg  every morning 4. Naltrexone 50mg  daily 5. Hydroxyzine 25mg  as needed  RTC 6 months  Patient advised to contact office with any questions, adverse effects, or acute worsening in signs and symptoms.    Diagnoses and all orders for this visit:  PTSD (post-traumatic stress disorder)  Alcohol abuse  GAD (generalized anxiety disorder)  Insomnia, unspecified type  Recurrent major depressive disorder, remission status unspecified (HCC)    Please see After Visit Summary for patient specific instructions.  No future appointments.  No orders of the defined types were placed in this encounter.   -------------------------------

## 2021-04-12 ENCOUNTER — Telehealth: Payer: Self-pay

## 2021-04-12 ENCOUNTER — Other Ambulatory Visit: Payer: Self-pay

## 2021-04-12 ENCOUNTER — Other Ambulatory Visit (HOSPITAL_COMMUNITY): Payer: Self-pay

## 2021-04-12 ENCOUNTER — Other Ambulatory Visit: Payer: BC Managed Care – PPO

## 2021-04-12 DIAGNOSIS — Z79899 Other long term (current) drug therapy: Secondary | ICD-10-CM

## 2021-04-12 DIAGNOSIS — Z113 Encounter for screening for infections with a predominantly sexual mode of transmission: Secondary | ICD-10-CM

## 2021-04-12 DIAGNOSIS — B20 Human immunodeficiency virus [HIV] disease: Secondary | ICD-10-CM

## 2021-04-12 NOTE — Telephone Encounter (Signed)
[facility] Patient Product/process development scientist completed.    The patient is insured through [employer] and has a 0.00 copay.  We will continue to follow to see if copay assistance is needed.  Clearance Coots, CPhT Specialty Pharmacy Patient [facility] for Infectious Disease Phone: [phone] Fax:  [phone]

## 2021-04-14 ENCOUNTER — Other Ambulatory Visit: Payer: BC Managed Care – PPO

## 2021-04-14 ENCOUNTER — Other Ambulatory Visit: Payer: Self-pay

## 2021-04-14 ENCOUNTER — Other Ambulatory Visit (HOSPITAL_COMMUNITY)
Admission: RE | Admit: 2021-04-14 | Discharge: 2021-04-14 | Disposition: A | Discharge status: Home / Self Care | Payer: BC Managed Care – PPO | Source: Ambulatory Visit | Attending: Infectious Diseases | Admitting: Infectious Diseases

## 2021-04-14 DIAGNOSIS — Z113 Encounter for screening for infections with a predominantly sexual mode of transmission: Secondary | ICD-10-CM

## 2021-04-14 DIAGNOSIS — B20 Human immunodeficiency virus [HIV] disease: Secondary | ICD-10-CM

## 2021-04-14 DIAGNOSIS — Z79899 Other long term (current) drug therapy: Secondary | ICD-10-CM

## 2021-04-15 LAB — URINALYSIS
Bilirubin Urine: NEGATIVE
Glucose, UA: NEGATIVE
Hgb urine dipstick: NEGATIVE
Leukocytes,Ua: NEGATIVE
Nitrite: NEGATIVE
Specific Gravity, Urine: 1.035 (ref 1.001–1.035)
pH: 5.5 (ref 5.0–8.0)

## 2021-04-15 LAB — URINE CYTOLOGY ANCILLARY ONLY
Chlamydia: NEGATIVE
Comment: NEGATIVE
Comment: NORMAL
Neisseria Gonorrhea: NEGATIVE

## 2021-04-15 LAB — T-HELPER CELL (CD4) - (RCID CLINIC ONLY)
CD4 % Helper T Cell: 36 % (ref 33–65)
CD4 T Cell Abs: 1044 /uL (ref 400–1790)

## 2021-04-20 ENCOUNTER — Ambulatory Visit: Payer: BC Managed Care – PPO

## 2021-04-21 ENCOUNTER — Other Ambulatory Visit: Payer: Self-pay | Admitting: Family Medicine

## 2021-04-21 DIAGNOSIS — R140 Abdominal distension (gaseous): Secondary | ICD-10-CM

## 2021-04-21 DIAGNOSIS — R1084 Generalized abdominal pain: Secondary | ICD-10-CM

## 2021-04-23 ENCOUNTER — Other Ambulatory Visit: Payer: Self-pay

## 2021-04-23 ENCOUNTER — Ambulatory Visit
Admission: RE | Admit: 2021-04-23 | Discharge: 2021-04-23 | Disposition: A | Discharge status: Home / Self Care | Payer: BC Managed Care – PPO | Source: Ambulatory Visit | Attending: Family Medicine | Admitting: Family Medicine

## 2021-04-23 DIAGNOSIS — R1084 Generalized abdominal pain: Secondary | ICD-10-CM

## 2021-04-23 DIAGNOSIS — R140 Abdominal distension (gaseous): Secondary | ICD-10-CM

## 2021-04-27 ENCOUNTER — Encounter: Payer: Self-pay | Admitting: Infectious Diseases

## 2021-04-28 ENCOUNTER — Other Ambulatory Visit (HOSPITAL_COMMUNITY): Payer: Self-pay

## 2021-04-29 ENCOUNTER — Encounter: Payer: Self-pay | Admitting: Infectious Diseases

## 2021-04-29 ENCOUNTER — Ambulatory Visit (INDEPENDENT_AMBULATORY_CARE_PROVIDER_SITE_OTHER): Payer: BC Managed Care – PPO | Admitting: Pharmacist

## 2021-04-29 ENCOUNTER — Ambulatory Visit: Payer: BC Managed Care – PPO | Admitting: Infectious Diseases

## 2021-04-29 ENCOUNTER — Other Ambulatory Visit: Payer: Self-pay

## 2021-04-29 ENCOUNTER — Other Ambulatory Visit (HOSPITAL_COMMUNITY): Payer: Self-pay

## 2021-04-29 DIAGNOSIS — F331 Major depressive disorder, recurrent, moderate: Secondary | ICD-10-CM

## 2021-04-29 DIAGNOSIS — B20 Human immunodeficiency virus [HIV] disease: Secondary | ICD-10-CM

## 2021-04-29 DIAGNOSIS — Z8619 Personal history of other infectious and parasitic diseases: Secondary | ICD-10-CM | POA: Insufficient documentation

## 2021-04-29 DIAGNOSIS — B009 Herpesviral infection, unspecified: Secondary | ICD-10-CM | POA: Insufficient documentation

## 2021-04-29 DIAGNOSIS — A530 Latent syphilis, unspecified as early or late: Secondary | ICD-10-CM | POA: Insufficient documentation

## 2021-04-29 DIAGNOSIS — R12 Heartburn: Secondary | ICD-10-CM | POA: Insufficient documentation

## 2021-04-29 MED ORDER — ABACAVIR-DOLUTEGRAVIR-LAMIVUD 600-50-300 MG PO TABS
1.0000 | ORAL_TABLET | Freq: Every day | ORAL | 6 refills | Status: DC
  Filled 2021-04-29 – 2021-05-05 (×2): qty 30, 30d supply, fill #0
  Filled 2021-06-01: qty 30, 30d supply, fill #1

## 2021-04-29 MED ORDER — ABACAVIR-DOLUTEGRAVIR-LAMIVUD 600-50-300 MG PO TABS: 1.0000 | ORAL_TABLET | Freq: Every day | ORAL | 6 refills | Status: DC

## 2021-04-29 NOTE — Progress Notes (Addendum)
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 male with HIV dx 04/11/2007. Started medications in 2009.  In care with [facility] 2016 - 2021.  CD4 nadir > 200 VL < 20  HIV Risk: sexual, msm History of OIs: none Intake Labs 03/2021: Hep B sAg (-), sAb (+), cAb (-); Hep A (+), Hep C (+, RNA - 03/2021) Quantiferon (-) HLA B*5701 (-) G6PD: ()   Previous Regimens: Atripla 2009 - 2016 Triumeq, 2017 - current   Genotypes: None on file  Subjective:   Chief Complaint  Patient presents with   New Patient (Initial Visit)    B20      HPI: Candelario is here to transfer care related to HIV.   Hx colitis (2015), depression, hepatitis C s/p treatment with Mavyret x 12 weeks in 12/2016 following acute infection, syphilis, anal fissure and anal warts.   Previously was working with Vicente Masson, ANP in [place] since 2016.  Has been living here in [place] with his husband now for some time.  Initial treatment regimen in 2009 with Atripla once daily.  He was transition to Triumeq once daily in 2017 which he is currently maintained on and tolerating well.  He has a husband locally who is working in [employer] and he is interested in talking about Cabenuva injections for treatment.  He does have a job taking his medication every day without any missed doses.  His vaccinations are up-to-date including the COVID-vaccine and booster though he does not have his  card with him today so we can obtain records. March 2020 labs indicated VL undetectable and CD4 1232 (33%).   Has a mental health practitioner in [place], [place] (Dr. Tilden Dome) who is helping with his depression.  He does have a history of suicide attempt in the past but does feel like he is doing better on the effects or than the Lexapro.  Recently completed treatment for alcohol abuse.  He is working with a PCP, Dr. Jackson Latino, and underwent a liver ultrasound test recently but not certain what the results are.   Depression screen [facility] 2/9 04/29/2021  Decreased Interest 0  Down, Depressed, Hopeless 0  PHQ - 2 Score 0     Review of Systems  Constitutional:  Negative for chills, fever, malaise/fatigue and weight loss.  HENT:  Negative for sore throat.   Respiratory:  Negative for cough, sputum production and shortness of breath.   Cardiovascular: Negative.   Gastrointestinal:  Negative for abdominal pain, diarrhea and vomiting.  Musculoskeletal:  Negative for joint pain, myalgias and neck pain.  Skin:  Negative for rash.  Neurological:  Negative for headaches.  Psychiatric/Behavioral:  Negative for depression and substance abuse. The patient is not nervous/anxious.   All other systems reviewed and are negative.  Past Medical History:  Diagnosis Date   HIV (human immunodeficiency virus infection) ([place])     Outpatient Medications Prior to Visit  Medication Sig  Dispense Refill   atorvastatin (LIPITOR) 10 MG tablet Take 10 mg by mouth daily.     gabapentin (NEURONTIN) 300 MG capsule Take 1 capsule (300 mg total) by mouth 2 (two) times daily. 60 capsule 5   hydrOXYzine (ATARAX/VISTARIL) 25 MG tablet Take 25 mg by mouth 2 (two) times daily as needed.     MELATONIN MAXIMUM STRENGTH 5 MG TABS Take 5 mg by mouth at bedtime as needed.     mirtazapine (REMERON) 30 MG tablet Take 1 tablet (30 mg total) by mouth at bedtime. 30 tablet 5   naltrexone (DEPADE) 50 MG tablet Take 1 tablet (50 mg  total) by mouth daily. 30 tablet 2   traZODone (DESYREL) 50 MG tablet Take 25 mg by mouth at bedtime as needed.     valACYclovir (VALTREX) 500 MG tablet Take 500 mg by mouth daily.     venlafaxine XR (EFFEXOR-XR) 150 MG 24 hr capsule Take 1 capsule (150 mg total) by mouth daily. 30 capsule 5   abacavir-dolutegravir-lamiVUDine (TRIUMEQ) 600-50-300 MG tablet Take 1 tablet by mouth daily.     albuterol (VENTOLIN HFA) 108 (90 Base) MCG/ACT inhaler Inhale 2 puffs into the lungs every 4 (four) hours as needed for wheezing or shortness of breath. 18 g 0   escitalopram (LEXAPRO) 10 MG tablet Take 10 mg by mouth daily.     gabapentin (NEURONTIN) 100 MG capsule Take 200 mg by mouth daily.     gabapentin (NEURONTIN) 300 MG capsule Take by mouth.     hydrOXYzine (ATARAX/VISTARIL) 10 MG tablet Take by mouth.     No facility-administered medications prior to visit.     No Known Allergies  Social History   Tobacco Use   Smoking status: Every Day    Pack years: 0.00    Types: E-cigarettes   Smokeless tobacco: Never  Vaping Use   Vaping Use: Never used  Substance Use Topics   Alcohol use: Not Currently   Drug use: Not Currently    No family history on file.  Social History   Substance and Sexual Activity  Sexual Activity Yes   Partners: Male   Comment: declined condoms - 7.7.2022     Objective:   Vitals:   04/29/21 0901  BP: 126/89  Pulse: 87  Temp: 98 F (36.7 C)  TempSrc: Oral  Weight: 194 lb (88 kg)  Height: _0  (1.727 m)   Body mass index is 29.5 kg/m.  Physical Exam Vitals reviewed.  Constitutional:      Appearance: He is well-developed.     Comments: Seated comfortably in chair during visit.   HENT:     Mouth/Throat:     Dentition: Normal dentition. No dental abscesses.  Cardiovascular:     Rate and Rhythm: Normal rate and regular rhythm.     Heart sounds: Normal heart sounds.  Pulmonary:     Effort: Pulmonary effort is normal.     Breath sounds: Normal  breath sounds.  Abdominal:     General: There is no distension.     Palpations: Abdomen is soft.     Tenderness: no abdominal tenderness  Lymphadenopathy:     Cervical: No cervical adenopathy.  Skin:    General: Skin is warm and dry.     Findings: No rash.  Neurological:     Mental Status: He is alert and oriented to person, place, and time.  Psychiatric:        Judgment: Judgment normal.     Comments:  In good spirits today and engaged in care discussion.     Lab Results Lab Results  Component Value Date   WBC 6.9 04/14/2021   HGB 14.1 04/14/2021   HCT 45.6 04/14/2021   MCV 75.2 (L) 04/14/2021   PLT 235 04/14/2021    Lab Results  Component Value Date   CREATININE 1.00 04/14/2021   BUN 12 04/14/2021   NA 142 04/14/2021   K 4.2 04/14/2021   CL 107 04/14/2021   CO2 25 04/14/2021    Lab Results  Component Value Date   ALT 52 (H) 04/14/2021   AST 32 04/14/2021   BILITOT 0.3 04/14/2021    Lab Results  Component Value Date   CHOL 129 04/14/2021   HDL 27 (L) 04/14/2021   LDLCALC 74 04/14/2021   TRIG 180 (H) 04/14/2021   CHOLHDL 4.8 04/14/2021   HIV 1 RNA Quant (copies/mL)  Date Value  04/14/2021 NOT DETECTED   CD4 T Cell Abs (/uL)  Date Value  04/14/2021 1,044     Assessment & Plan:   Problem List Items Addressed This Visit       Unprioritized   Major depressive disorder, recurrent, moderate (HCC)    Stable on Effexor + Mirtazapine prescribed by mental health team.        Relevant Medications   hydrOXYzine (ATARAX/VISTARIL) 25 MG tablet   traZODone (DESYREL) 50 MG tablet   Latent syphilis    Serofast titer 1:4 indicating no new infection concern. Will continue to follow 1-2 times a year or as needed for monitoring.        Relevant Medications   valACYclovir (VALTREX) 500 MG tablet   HIV infection ([place])    New patient here to establish for HIV care.   Chisom is doing well on once daily Triumeq with undetectable viral load and seems to have  been under very good control since he started medications in 2009.  CD4 with full recovery 1100 and VL , 20.  We spent time discussing potential transition to injectable Cabenuva.  He is interested in the every 2 month dosing regimen.  We will look into seeing what his insurance access is; he is open to referral to [facility] infusion for injections if more affordable for him.   I discussed with Leda Gauze treatment options/side effects, benefits of treatment and long-term outcomes. I discussed how HIV is transmitted and the process of untreated HIV including increased risk for opportunistic infections, cancer, dementia and renal failure. Patient was counseled on routine HIV care including medication adherence, blood monitoring, necessary vaccines and follow up visits. Counseled regarding safe sex practices including: condom use, partner disclosure, limiting partners. Patient spent time talking with our pharmacist Estill Bamberg regarding successful practices of ART and understands to reach out to our clinic in the future with questions.   Will refill Triumeq for HIV treatment via specialty pharmacy at [facility].   General introduction to our clinic and integrated services.  He has access to a private dentist and no acute needs noted today.  He is working with a PCP and a Herbalist as well.  I spent greater than 45 minutes with the patient today. Greater than 70% of the time spent face-to-face counseling and coordination of care re: HIV and health maintenance.         Relevant Medications   valACYclovir (VALTREX) 500 MG tablet   Herpes simplex    Using valtrex PRN for outbreaks. Continue on demand treatment as he  has been taking.        Relevant Medications   valACYclovir (VALTREX) 500 MG tablet   Hepatitis C virus infection cured after antiviral drug therapy    Treated with mavyret in 2018 in the setting of acute hepatitis c infection. Presume given it was  early his risk for cirrhosis is extremely low. Reviewed recent abdominal ultrasound results with him ordered by his PCP - hepatic steatosis noted without any findings of cirrhosis or tumor. He does not need any routine HCC screening.  Hep C RNA negative recently.  Discussed he will always have a positive Hep C antibody despite cure.  Can get reinfected with at risk behavior.   ALT mildly elevated - suspect related to hepatic steatosis vs naltrexone for alcohol use disorder. Will need to monitor if we switch to [place].         Janene Madeira, MSN, NP-C [facility] for Infectious [place] Pager: [phone] Office: [phone]   04/29/21  2:48 PM

## 2021-04-29 NOTE — Progress Notes (Signed)
HPI: Justin [name] is a 40 y.o. male who presents to the [facility] clinic to set up care for his ongoing HIV infection.  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   GABAPENTIN (NEURONTIN) 300 MG CAPSULE    Take 1 capsule (300 mg total) by mouth 2 (two) times daily.   HYDROXYZINE (ATARAX/VISTARIL) 25 MG TABLET    Take 25 mg by mouth 2 (two) times daily as needed.   MELATONIN MAXIMUM STRENGTH 5 MG TABS    Take 5 mg by mouth at bedtime as needed.   MIRTAZAPINE (REMERON) 30 MG TABLET    Take 1 tablet (30 mg total) by mouth at bedtime.   NALTREXONE (DEPADE) 50 MG TABLET    Take 1 tablet (50 mg total) by mouth daily.   TRAZODONE (DESYREL) 50 MG TABLET    Take 25 mg by mouth at bedtime as needed.   VALACYCLOVIR (VALTREX) 500 MG TABLET    Take 500 mg by mouth daily.   VENLAFAXINE XR (EFFEXOR-XR) 150 MG 24 HR CAPSULE    Take 1 capsule (150 mg total) by mouth daily.  Modified Medications   Modified Medication Previous Medication   ABACAVIR-DOLUTEGRAVIR-LAMIVUDINE (TRIUMEQ) 600-50-300 MG TABLET abacavir-dolutegravir-lamiVUDine (TRIUMEQ) 600-50-300 MG tablet      Take 1 tablet by mouth daily.    Take 1 tablet by mouth daily.  Discontinued Medications   No medications on file    Allergies: No Known Allergies  Past Medical History: Past Medical History:  Diagnosis Date   HIV (human immunodeficiency virus infection) (HCC)     Social History: Social History    Socioeconomic History   Marital status: Married    Spouse name: Not on file   Number of children: Not on file   Years of education: Not on file   Highest education level: Not on file  Occupational History   Not on file  Tobacco Use   Smoking status: Every Day    Pack years: 0.00    Types: E-cigarettes   Smokeless tobacco: Never  Vaping Use   Vaping Use: Never used  Substance and Sexual Activity   Alcohol use: Not Currently   Drug use: Not Currently   Sexual activity: Yes    Partners: Male    Comment: declined condoms - 7.7.2022  Other Topics Concern   Not on file  Social History Narrative   Not on file   Social Determinants of Health   Financial Resource Strain: Not on file  Food Insecurity: Not on file  Transportation Needs: Not on file  Physical Activity: Not on file  Stress: Not on file  Social Connections: Not on file    Labs: Lab Results  Component Value Date   HIV1RNAQUANT NOT DETECTED 04/14/2021   CD4TABS 1,044 04/14/2021    RPR and STI Lab Results  Component Value Date   LABRPR Reactive (A) 01/26/2021   LABRPR Reactive (A) 04/11/2020    STI Results GC CT  04/14/2021  Negative Negative  01/26/2021 Negative Negative  04/11/2020 Negative Negative    Hepatitis B Lab Results  Component Value Date   HEPBSAB REACTIVE (A) 04/14/2021   HEPBSAG NON-REACTIVE 04/14/2021   HEPBCAB NON-REACTIVE 04/14/2021   Hepatitis C Lab Results  Component Value Date   HEPCAB REACTIVE (A) 04/14/2021   HCVRNAPCRQN <15 NOT DETECTED 04/14/2021   Hepatitis A Lab Results  Component Value Date   HAV REACTIVE (A) 04/14/2021   Lipids: Lab Results  Component Value Date   CHOL 129 04/14/2021   TRIG 180 (H) 04/14/2021   HDL 27 (L) 04/14/2021   CHOLHDL 4.8 04/14/2021   LDLCALC 74 04/14/2021    Current HIV Regimen: Triumeq  Assessment: Justin [name] is here today to transfer his HIV care to [place]. He is taking Triumeq and having no issues tolerating it or remembering  to take it. His HIV viral load is undetectable with a healthy CD4 count. He was getting hisTtriumeq from a specialty pharmacy but states he had some trouble with them in the past. I checked and our [facility] pharmacy can fill and mail to his house. Will send Rx there and he will get his Triumeq tomorrow. He has several pills left in his current bottle.   Plan: - Continue Triumq - Set up mail order from [facility]. Pantera Winterrowd, PharmD, BCIDP, AAHIVP, CPP Clinical Pharmacist Practitioner Infectious Diseases Clinical Pharmacist [facility] 04/29/2021, 11:27 AM

## 2021-04-29 NOTE — Assessment & Plan Note (Signed)
Using valtrex PRN for outbreaks. Continue on demand treatment as he has been taking.

## 2021-04-29 NOTE — Assessment & Plan Note (Signed)
Stable on Effexor + Mirtazapine prescribed by mental health team.

## 2021-04-29 NOTE — Assessment & Plan Note (Addendum)
New patient here to establish for HIV care.   Justin [name] is doing well on once daily Triumeq with undetectable viral load and seems to have been under very good control since he started medications in 2009.  CD4 with full recovery 1100 and VL , 20.  We spent time discussing potential transition to injectable Cabenuva.  He is interested in the every 2 month dosing regimen.  We will look into seeing what his insurance access is; he is open to referral to [facility] infusion for injections if more affordable for him.   I discussed with Gunnar Bulla treatment options/side effects, benefits of treatment and long-term outcomes. I discussed how HIV is transmitted and the process of untreated HIV including increased risk for opportunistic infections, cancer, dementia and renal failure. Patient was counseled on routine HIV care including medication adherence, blood monitoring, necessary vaccines and follow up visits. Counseled regarding safe sex practices including: condom use, partner disclosure, limiting partners. Patient spent time talking with our pharmacist Marchelle Folks regarding successful practices of ART and understands to reach out to our clinic in the future with questions.   Will refill Triumeq for HIV treatment via specialty pharmacy at [facility].   General introduction to our clinic and integrated services.  He has access to a private dentist and no acute needs noted today.  He is working with a PCP and a Advertising copywriter as well.  I spent greater than 45 minutes with the patient today. Greater than 70% of the time spent face-to-face counseling and coordination of care re: HIV and health maintenance.

## 2021-04-29 NOTE — Assessment & Plan Note (Signed)
Serofast titer 1:4 indicating no new infection concern. Will continue to follow 1-2 times a year or as needed for monitoring.

## 2021-04-29 NOTE — Assessment & Plan Note (Signed)
Treated with mavyret in 2018 in the setting of acute hepatitis c infection. Presume given it was early his risk for cirrhosis is extremely low. Reviewed recent abdominal ultrasound results with him ordered by his PCP - hepatic steatosis noted without any findings of cirrhosis or tumor. He does not need any routine HCC screening.  Hep C RNA negative recently.  Discussed he will always have a positive Hep C antibody despite cure.  Can get reinfected with at risk behavior.   ALT mildly elevated - suspect related to hepatic steatosis vs naltrexone for alcohol use disorder. Will need to monitor if we switch to Cabenuva.

## 2021-04-29 NOTE — Patient Instructions (Addendum)
Very nice to meet you today! Welcome to our clinic.   Pleaes continue your Triumeq for now until we can get word from your insurance about Guinea.   [facility] - [email or web address]  The P:E Diet is a great book that I think you would enjoy reading to help with improving the findings of the fatty liver on the ultrasound. This is likely the cause of some mild inflammation there.  Please schedule a follow up for now in 6-8 months, this will be adjusted based on access and ability to offer you Cabenuva.     CABENUVA is the new medication to treat HIV. It is an injection in each butt cheek every 30 days at this time.  Largely the side effects were of course related to the injection itself, but during the studies where this was used to treat HIV patients there were only a very small (< 5) number of patients that stopped it due to pain from the shots. Most of the patients in the trials reported overall improvement in the pain associated with the shots over time.  Patient Information Link: https://gskpro.com/content/dam/global/hcpportal/en_US/Prescribing_Information/Cabenuva/[email or web address]-PI-[email or web address]-[email or web address].PDF#[email or web address]

## 2021-05-03 ENCOUNTER — Other Ambulatory Visit (HOSPITAL_COMMUNITY): Payer: Self-pay

## 2021-05-05 ENCOUNTER — Other Ambulatory Visit: Payer: Self-pay

## 2021-05-05 ENCOUNTER — Encounter: Payer: Self-pay | Admitting: Adult Health

## 2021-05-05 ENCOUNTER — Other Ambulatory Visit (HOSPITAL_COMMUNITY): Payer: Self-pay

## 2021-05-05 ENCOUNTER — Ambulatory Visit: Payer: BC Managed Care – PPO | Admitting: Adult Health

## 2021-05-05 ENCOUNTER — Telehealth: Payer: Self-pay

## 2021-05-05 DIAGNOSIS — F411 Generalized anxiety disorder: Secondary | ICD-10-CM

## 2021-05-05 DIAGNOSIS — G4700 Insomnia, unspecified: Secondary | ICD-10-CM | POA: Diagnosis not present

## 2021-05-05 DIAGNOSIS — F1010 Alcohol abuse, uncomplicated: Secondary | ICD-10-CM

## 2021-05-05 DIAGNOSIS — F429 Obsessive-compulsive disorder, unspecified: Secondary | ICD-10-CM

## 2021-05-05 DIAGNOSIS — F339 Major depressive disorder, recurrent, unspecified: Secondary | ICD-10-CM

## 2021-05-05 DIAGNOSIS — F4310 Post-traumatic stress disorder, unspecified: Secondary | ICD-10-CM | POA: Diagnosis not present

## 2021-05-05 LAB — COMPLETE METABOLIC PANEL WITH GFR
AG Ratio: 1.4 (calc) (ref 1.0–2.5)
ALT: 52 U/L — ABNORMAL HIGH (ref 9–46)
AST: 32 U/L (ref 10–40)
Albumin: 4.4 g/dL (ref 3.6–5.1)
Alkaline phosphatase (APISO): 48 U/L (ref 36–130)
BUN: 12 mg/dL (ref 7–25)
CO2: 25 mmol/L (ref 20–32)
Calcium: 9.3 mg/dL (ref 8.6–10.3)
Chloride: 107 mmol/L (ref 98–110)
Creat: 1 mg/dL (ref 0.60–1.35)
GFR, Est African American: 110 mL/min/{1.73_m2} (ref >=60)
GFR, Est Non African American: 95 mL/min/{1.73_m2} (ref >=60)
Globulin: 3.1 g/dL (calc) (ref 1.9–3.7)
Glucose, Bld: 98 mg/dL (ref 65–99)
Potassium: 4.2 mmol/L (ref 3.5–5.3)
Sodium: 142 mmol/L (ref 135–146)
Total Bilirubin: 0.3 mg/dL (ref 0.2–1.2)
Total Protein: 7.5 g/dL (ref 6.1–8.1)

## 2021-05-05 LAB — LIPID PANEL
Cholesterol: 129 mg/dL (ref <200)
HDL: 27 mg/dL — ABNORMAL LOW (ref >=40)
LDL Cholesterol (Calc): 74 mg/dL (calc)
Non-HDL Cholesterol (Calc): 102 mg/dL (calc) (ref <130)
Total CHOL/HDL Ratio: 4.8 (calc) (ref <5.0)
Triglycerides: 180 mg/dL — ABNORMAL HIGH (ref <150)

## 2021-05-05 LAB — CBC WITH DIFFERENTIAL/PLATELET
Absolute Monocytes: 683 cells/uL (ref 200–950)
Basophils Absolute: 28 cells/uL (ref 0–200)
Basophils Relative: 0.4 %
Eosinophils Absolute: 21 cells/uL (ref 15–500)
Eosinophils Relative: 0.3 %
HCT: 45.6 % (ref 38.5–50.0)
Hemoglobin: 14.1 g/dL (ref 13.2–17.1)
Lymphs Abs: 2981 cells/uL (ref 850–3900)
MCH: 23.3 pg — ABNORMAL LOW (ref 27.0–33.0)
MCHC: 30.9 g/dL — ABNORMAL LOW (ref 32.0–36.0)
MCV: 75.2 fL — ABNORMAL LOW (ref 80.0–100.0)
MPV: 12.3 fL (ref 7.5–12.5)
Monocytes Relative: 9.9 %
Neutro Abs: 3188 cells/uL (ref 1500–7800)
Neutrophils Relative %: 46.2 %
Platelets: 235 10*3/uL (ref 140–400)
RBC: 6.06 10*6/uL — ABNORMAL HIGH (ref 4.20–5.80)
RDW: 14.5 % (ref 11.0–15.0)
Total Lymphocyte: 43.2 %
WBC: 6.9 10*3/uL (ref 3.8–10.8)

## 2021-05-05 LAB — HEPATITIS B SURFACE ANTIBODY,QUALITATIVE: Hep B S Ab: REACTIVE — AB

## 2021-05-05 LAB — QUANTIFERON-TB GOLD PLUS
Mitogen-NIL: >10 IU/mL
NIL: 0.09 IU/mL
QuantiFERON-TB Gold Plus: NEGATIVE
TB1-NIL: 0.06 IU/mL
TB2-NIL: 0.05 IU/mL

## 2021-05-05 LAB — HEPATITIS C RNA QUANTITATIVE
HCV Quantitative Log: <1.18 Log IU/mL
HCV RNA, PCR, QN: <15 IU/mL

## 2021-05-05 LAB — HIV-1 RNA ULTRAQUANT REFLEX TO GENTYP+
HIV 1 RNA Quant: NOT DETECTED copies/mL
HIV-1 RNA Quant, Log: NOT DETECTED Log copies/mL

## 2021-05-05 LAB — HIV-1/2 AB - DIFFERENTIATION
HIV-1 antibody: POSITIVE — AB
HIV-2 Ab: NEGATIVE

## 2021-05-05 LAB — HLA B*5701: HLA-B*5701 w/rflx HLA-B High: NEGATIVE

## 2021-05-05 LAB — HIV ANTIBODY (ROUTINE TESTING W REFLEX): HIV 1&2 Ab, 4th Generation: REACTIVE — AB

## 2021-05-05 LAB — HEPATITIS B SURFACE ANTIGEN: Hepatitis B Surface Ag: NONREACTIVE

## 2021-05-05 LAB — HEPATITIS C ANTIBODY
Hepatitis C Ab: REACTIVE — AB
SIGNAL TO CUT-OFF: 16 — ABNORMAL HIGH (ref <1.00)

## 2021-05-05 LAB — HEPATITIS A ANTIBODY, TOTAL: Hepatitis A AB,Total: REACTIVE — AB

## 2021-05-05 LAB — HEPATITIS B CORE ANTIBODY, TOTAL: Hep B Core Total Ab: NONREACTIVE

## 2021-05-05 NOTE — Progress Notes (Signed)
Justin [name] [id number] 03-Jul-1981 40 y.o.  Subjective:   Patient ID:  Justin [name] is a 40 y.o. (DOB 1981/03/23) male.  Chief Complaint: No chief complaint on file.   HPI Justin [name] presents to the office today for follow-up of MDD, GAD, OCD, and insomnia  Describes mood today as "ok". Pleasant. Mood symptoms - denies depression and anxiety. Reports irritability.  Decreased worry and rumination. Waking up tearful some mornings. Stating "I'm doing better". Feels like medications are working well. Currently involved in a virtual IOP program. Celebrating 78 days of sobriety. Has an app on phone "my sober life" that is helpful. Stable interest and motivation. Taking medications as prescribed.  Energy levels fluctuating. Active, does not have a regular exercise routine. Walking some days. Enjoys some usual interests and activities. Married. Lives with husband and puppy "[place]". Mother doing well. Spending time with family. Appetite adequate. Weight gain - 194 pounds. Sleeping well. Averages 6 to 8 hours.  Having focus issues at times - easily distractible. Concentration stable. Completing tasks. Managing aspects of household. Plans to return to work.  Denies SI or HI.  Denies AH or VH.  Previous medications: Lexapro, Celexa - SSE - 1 and 1/2 years.     PHQ2-9    Flowsheet Row Office Visit from 04/29/2021 in [facility] for Infectious Disease  PHQ-2 Total Score 0      Flowsheet Row ED from 01/26/2021 in [facility] RISK CATEGORY No Risk        Review of Systems:  Review of Systems  Musculoskeletal:  Negative for gait problem.  Neurological:  Negative for tremors.  Psychiatric/Behavioral:         Please refer to HPI   Medications: I have reviewed the patient's current medications.  Current Outpatient Medications  Medication Sig Dispense Refill   abacavir-dolutegravir-lamiVUDine (TRIUMEQ) 600-50-300 MG tablet Take 1  tablet by mouth daily. 30 tablet 6   atorvastatin (LIPITOR) 10 MG tablet Take 10 mg by mouth daily.     gabapentin (NEURONTIN) 300 MG capsule Take 1 capsule (300 mg total) by mouth 2 (two) times daily. 60 capsule 5   hydrOXYzine (ATARAX/VISTARIL) 25 MG tablet Take 25 mg by mouth 2 (two) times daily as needed.     MELATONIN MAXIMUM STRENGTH 5 MG TABS Take 5 mg by mouth at bedtime as needed.     mirtazapine (REMERON) 30 MG tablet Take 1 tablet (30 mg total) by mouth at bedtime. 30 tablet 5   naltrexone (DEPADE) 50 MG tablet Take 1 tablet (50 mg total) by mouth daily. 30 tablet 2   traZODone (DESYREL) 50 MG tablet Take 25 mg by mouth at bedtime as needed.     valACYclovir (VALTREX) 500 MG tablet Take 500 mg by mouth daily.     venlafaxine XR (EFFEXOR-XR) 150 MG 24 hr capsule Take 1 capsule (150 mg total) by mouth daily. 30 capsule 5   No current facility-administered medications for this visit.    Medication Side Effects: None  Allergies: No Known Allergies  Past Medical History:  Diagnosis Date   HIV (human immunodeficiency virus infection) (HCC)     Past Medical History, Surgical history, Social history, and Family history were reviewed and updated as appropriate.   Please see review of systems for further details on the patient's review from today.   Objective:   Physical Exam:  There were no vitals taken for this visit.  Physical Exam Constitutional:  General: He is not in acute distress. Musculoskeletal:        General: No deformity.  Neurological:     Mental Status: He is alert and oriented to person, place, and time.     Coordination: Coordination normal.  Psychiatric:        Attention and Perception: Attention and perception normal. He does not perceive auditory or visual hallucinations.        Mood and Affect: Mood normal. Mood is not anxious or depressed. Affect is not labile, blunt, angry or inappropriate.        Speech: Speech normal.        Behavior: Behavior  normal.        Thought Content: Thought content normal. Thought content is not paranoid or delusional. Thought content does not include homicidal or suicidal ideation. Thought content does not include homicidal or suicidal plan.        Cognition and Memory: Cognition and memory normal.        Judgment: Judgment normal.     Comments: Insight intact    Lab Review:     Component Value Date/Time   NA 142 04/14/2021 1404   K 4.2 04/14/2021 1404   CL 107 04/14/2021 1404   CO2 25 04/14/2021 1404   GLUCOSE 98 04/14/2021 1404   BUN 12 04/14/2021 1404   CREATININE 1.00 04/14/2021 1404   CALCIUM 9.3 04/14/2021 1404   PROT 7.5 04/14/2021 1404   AST 32 04/14/2021 1404   ALT 52 (H) 04/14/2021 1404   BILITOT 0.3 04/14/2021 1404   GFRNONAA 95 04/14/2021 1404   GFRAA 110 04/14/2021 1404       Component Value Date/Time   WBC 6.9 04/14/2021 1404   RBC 6.06 (H) 04/14/2021 1404   HGB 14.1 04/14/2021 1404   HCT 45.6 04/14/2021 1404   PLT 235 04/14/2021 1404   MCV 75.2 (L) 04/14/2021 1404   MCH 23.3 (L) 04/14/2021 1404   MCHC 30.9 (L) 04/14/2021 1404   RDW 14.5 04/14/2021 1404   LYMPHSABS 2,981 04/14/2021 1404   EOSABS 21 04/14/2021 1404   BASOSABS 28 04/14/2021 1404    No results found for: POCLITH, LITHIUM   No results found for: PHENYTOIN, PHENOBARB, VALPROATE, CBMZ   .res Assessment: Plan:    Plan:  1. Gabapentin 300mg  - 2 at hs 2. Remeron 30mg  at bedtime 3. Effexor XR 150mg  every morning 4. Naltrexone 50mg  daily 5. Hydroxyzine 25mg  as needed  RTC 3 months  Patient advised to contact office with any questions, adverse effects, or acute worsening in signs and symptoms.  Diagnoses and all orders for this visit:  PTSD (post-traumatic stress disorder)  Alcohol abuse  GAD (generalized anxiety disorder)  Insomnia, unspecified type  Recurrent major depressive disorder, remission status unspecified (HCC)  Obsessive-compulsive disorder, unspecified type    Please see  After Visit Summary for patient specific instructions.  Future Appointments  Date Time Provider Department Center  11/01/2021  2:00 PM [facility]  11/29/2021 10:00 AM , NP [facility]    No orders of the defined types were placed in this encounter.   -------------------------------

## 2021-05-05 NOTE — Telephone Encounter (Signed)
[facility] Patient Advocate Encounter  Justin [name] is covered under patient medical benefits the patient have already met there deductible , he will have an office visit of $45.00.   Ref #[id number]  Patient is enrolled in [place] [place] [facility] Phone: [phone] Fax:  [phone]

## 2021-05-12 ENCOUNTER — Other Ambulatory Visit: Payer: Self-pay | Admitting: Adult Health

## 2021-05-12 DIAGNOSIS — F411 Generalized anxiety disorder: Secondary | ICD-10-CM

## 2021-05-12 DIAGNOSIS — F4310 Post-traumatic stress disorder, unspecified: Secondary | ICD-10-CM

## 2021-05-14 ENCOUNTER — Encounter: Payer: Self-pay | Admitting: Pharmacist

## 2021-05-26 ENCOUNTER — Other Ambulatory Visit (HOSPITAL_COMMUNITY): Payer: Self-pay

## 2021-05-31 ENCOUNTER — Other Ambulatory Visit (HOSPITAL_COMMUNITY): Payer: Self-pay

## 2021-06-01 ENCOUNTER — Other Ambulatory Visit (HOSPITAL_COMMUNITY): Payer: Self-pay

## 2021-06-07 ENCOUNTER — Other Ambulatory Visit (HOSPITAL_COMMUNITY): Payer: Self-pay

## 2021-06-10 ENCOUNTER — Encounter: Payer: Self-pay | Admitting: Pharmacist

## 2021-06-10 ENCOUNTER — Other Ambulatory Visit: Payer: Self-pay

## 2021-06-10 ENCOUNTER — Ambulatory Visit (INDEPENDENT_AMBULATORY_CARE_PROVIDER_SITE_OTHER): Payer: BC Managed Care – PPO | Admitting: Pharmacist

## 2021-06-10 DIAGNOSIS — Z23 Encounter for immunization: Secondary | ICD-10-CM

## 2021-06-10 DIAGNOSIS — B20 Human immunodeficiency virus [HIV] disease: Secondary | ICD-10-CM

## 2021-06-10 MED ORDER — CABOTEGRAVIR & RILPIVIRINE ER 600 & 900 MG/3ML IM SUER
1.0000 | Freq: Once | INTRAMUSCULAR | Status: AC
Start: 2021-06-10 — End: 2021-06-10
  Administered 2021-06-10: 1 via INTRAMUSCULAR

## 2021-06-10 NOTE — Progress Notes (Signed)
HPI: Justin [name] is a 40 y.o. male who presents to the [facility] pharmacy clinic for [place] administration.  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   GABAPENTIN (NEURONTIN) 300 MG CAPSULE    Take 1 capsule (300 mg total) by mouth 2 (two) times daily.   HYDROXYZINE (ATARAX/VISTARIL) 25 MG TABLET    Take 25 mg by mouth 2 (two) times daily as needed.   MELATONIN MAXIMUM STRENGTH 5 MG TABS    Take 5 mg by mouth at bedtime as needed.   MIRTAZAPINE (REMERON) 30 MG TABLET    Take 1 tablet (30 mg total) by mouth at bedtime.   NALTREXONE (DEPADE) 50 MG TABLET    Take 1 tablet (50 mg total) by mouth daily.   TRAZODONE (DESYREL) 50 MG TABLET    Take 25 mg by mouth at bedtime as needed.   VALACYCLOVIR (VALTREX) 500 MG TABLET    Take 500 mg by mouth daily.   VENLAFAXINE XR (EFFEXOR-XR) 150 MG 24 HR CAPSULE    TAKE 1 CAPSULE BY MOUTH EVERY DAY  Modified Medications   No medications on file  Discontinued Medications   ABACAVIR-DOLUTEGRAVIR-LAMIVUDINE (TRIUMEQ) 600-50-300 MG TABLET    Take 1 tablet by mouth daily.    Allergies: No Known Allergies  Past Medical History: Past Medical History:  Diagnosis Date   HIV (human immunodeficiency virus infection) (HCC)     Social History: Social History   Socioeconomic History   Marital status: Married    Spouse name: Not on file   Number of children: Not on file   Years of education: Not on file    Highest education level: Not on file  Occupational History   Not on file  Tobacco Use   Smoking status: Every Day    Types: E-cigarettes   Smokeless tobacco: Never  Vaping Use   Vaping Use: Never used  Substance and Sexual Activity   Alcohol use: Not Currently   Drug use: Not Currently   Sexual activity: Yes    Partners: Male    Comment: declined condoms - 7.7.2022  Other Topics Concern   Not on file  Social History Narrative   Not on file   Social Determinants of Health   Financial Resource Strain: Not on file  Food Insecurity: Not on file  Transportation Needs: Not on file  Physical Activity: Not on file  Stress: Not on file  Social Connections: Not on file    Labs: Lab Results  Component Value Date   HIV1RNAQUANT NOT DETECTED 04/14/2021   CD4TABS 1,044 04/14/2021    RPR and STI Lab Results  Component Value Date   LABRPR Reactive (A) 01/26/2021   LABRPR Reactive (A) 04/11/2020    STI Results GC CT  04/14/2021 Negative Negative  01/26/2021 Negative Negative  04/11/2020 Negative Negative    Hepatitis B Lab Results  Component Value Date   HEPBSAB REACTIVE (A) 04/14/2021   HEPBSAG NON-REACTIVE 04/14/2021  HEPBCAB NON-REACTIVE 04/14/2021   Hepatitis C Lab Results  Component Value Date   HEPCAB REACTIVE (A) 04/14/2021   HCVRNAPCRQN <15 NOT DETECTED 04/14/2021   Hepatitis A Lab Results  Component Value Date   HAV REACTIVE (A) 04/14/2021   Lipids: Lab Results  Component Value Date   CHOL 129 04/14/2021   TRIG 180 (H) 04/14/2021   HDL 27 (L) 04/14/2021   CHOLHDL 4.8 04/14/2021   LDLCALC 74 04/14/2021    Current HIV Regimen: Triumeq  TARGET DATE: The 18 of the month  Assessment: Justin [name] presents today for their first initiation injection for Cabenuva. Counseled that Guinea is two separate intramuscular injections in the gluteal muscle on each side for each visit. Explained that the second injection is 30 days after the initial injection then  every 2 months thereafter. Discussed the need for viral load monitoring every 2 months for the first 6 months and then periodically afterwards as their provider sees the need. Discussed the rare but significant chance of developing resistance despite compliance. Explained that showing up to injection appointments is very important and warned that if 2 appointments are missed, it will be reassessed by their provider whether they are a good candidate for injection therapy. Counseled on possible side effects associated with the injections such as injection site pain, which is usually mild to moderate in nature, injection site nodules, and injection site reactions. Asked to call the clinic or send me a mychart message if they experience any issues, such as fatigue, nausea, headache, rash, or dizziness. Advised that they can take ibuprofen or tylenol for injection site pain if needed.   Administered cabotegravir 600mg /70mL in left upper outer quadrant of the gluteal muscle. Administered rilpivirine 900 mg/90mL in the right upper outer quadrant of the gluteal muscle. Monitored patient for 10 minutes after injection. Injections were tolerated well without issue. Counseled to stop taking Triumeq after today's dose and to call with any issues that may arise. Will make follow up appointments for second initiation injection in 30 days and then maintenance injections every 2 months thereafter for 6 months.   Plan: - Stop Triumeq - First Cabenuva injections administered - Second initiation injection scheduled for 9/19 with me - Call with any issues or questions - Also administered Jynneos vaccine #1/2 today  Jansen [name], PharmD, BCIDP, AAHIVP, CPP Clinical Pharmacist Practitioner Infectious Diseases Clinical Pharmacist [facility]

## 2021-06-10 NOTE — Progress Notes (Signed)
    [facility]  Name:  Justin [name]    MRN: [id number] DOB: 1980/12/17  JYNNEOS vaccine 0.49mL intradermal  NDC: [address]-35 Lot:  [id number] Expiration date: 06/10/2021 Site of administration: right forearm   06/10/2021  Mr. Justin [name] was observed post JYNNEOS immunization for 15 minutes without incident. He was provided with Vaccine Information Sheet and instruction to access the V-Safe system.   Mr. Justin [name] was instructed to call 911 with any severe reactions post vaccine: Difficulty breathing  Swelling of face and throat  A fast heartbeat  A bad rash all over body  Dizziness and weakness     Margarite Gouge, PharmD, CPP Clinical Pharmacist Practitioner Infectious Diseases Clinical Pharmacist [facility]

## 2021-06-11 MED ORDER — CABOTEGRAVIR & RILPIVIRINE ER 600 & 900 MG/3ML IM SUER: 1.0000 | INTRAMUSCULAR | 5 refills | Status: DC

## 2021-07-01 ENCOUNTER — Other Ambulatory Visit (HOSPITAL_COMMUNITY): Payer: Self-pay

## 2021-07-02 ENCOUNTER — Other Ambulatory Visit (HOSPITAL_COMMUNITY): Payer: Self-pay

## 2021-07-06 ENCOUNTER — Encounter: Payer: BC Managed Care – PPO | Admitting: Pharmacist

## 2021-07-12 ENCOUNTER — Ambulatory Visit (INDEPENDENT_AMBULATORY_CARE_PROVIDER_SITE_OTHER): Payer: BC Managed Care – PPO | Admitting: Pharmacist

## 2021-07-12 ENCOUNTER — Other Ambulatory Visit (HOSPITAL_COMMUNITY)
Admission: RE | Admit: 2021-07-12 | Discharge: 2021-07-12 | Disposition: A | Discharge status: Home / Self Care | Payer: BC Managed Care – PPO | Source: Ambulatory Visit | Attending: Infectious Diseases | Admitting: Infectious Diseases

## 2021-07-12 ENCOUNTER — Other Ambulatory Visit: Payer: Self-pay

## 2021-07-12 DIAGNOSIS — B20 Human immunodeficiency virus [HIV] disease: Secondary | ICD-10-CM | POA: Diagnosis not present

## 2021-07-12 DIAGNOSIS — Z113 Encounter for screening for infections with a predominantly sexual mode of transmission: Secondary | ICD-10-CM

## 2021-07-12 DIAGNOSIS — Z23 Encounter for immunization: Secondary | ICD-10-CM | POA: Diagnosis not present

## 2021-07-12 MED ORDER — CABOTEGRAVIR & RILPIVIRINE ER 600 & 900 MG/3ML IM SUER
1.0000 | Freq: Once | INTRAMUSCULAR | Status: AC
  Administered 2021-07-12: 1 via INTRAMUSCULAR

## 2021-07-12 NOTE — Progress Notes (Signed)
07/12/2021  HPI: Justin [name] is a 40 y.o. male who presents to the [facility] clinic for HIV follow-up.  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   CABOTEGRAVIR & RILPIVIRINE ER (CABENUVA) 600 & 900 MG/3ML INJECTION    Inject 1 kit into the muscle every 2 (two) months.   GABAPENTIN (NEURONTIN) 300 MG CAPSULE    Take 1 capsule (300 mg total) by mouth 2 (two) times daily.   HYDROXYZINE (ATARAX/VISTARIL) 25 MG TABLET    Take 25 mg by mouth 2 (two) times daily as needed.   MELATONIN MAXIMUM STRENGTH 5 MG TABS    Take 5 mg by mouth at bedtime as needed.   MIRTAZAPINE (REMERON) 30 MG TABLET    Take 1 tablet (30 mg total) by mouth at bedtime.   NALTREXONE (DEPADE) 50 MG TABLET    Take 1 tablet (50 mg total) by mouth daily.   TRAZODONE (DESYREL) 50 MG TABLET    Take 25 mg by mouth at bedtime as needed.   VALACYCLOVIR (VALTREX) 500 MG TABLET    Take 500 mg by mouth daily.   VENLAFAXINE XR (EFFEXOR-XR) 150 MG 24 HR CAPSULE    TAKE 1 CAPSULE BY MOUTH EVERY DAY  Modified Medications   No medications on file  Discontinued Medications   No medications on file    Allergies: No Known Allergies  Past Medical History: Past Medical History:  Diagnosis Date   HIV (human immunodeficiency virus infection) ([place])     Social History: Social History   Socioeconomic History   Marital status: Married    Spouse name: Not on file   Number of children:  Not on file   Years of education: Not on file   Highest education level: Not on file  Occupational History   Not on file  Tobacco Use   Smoking status: Every Day    Types: E-cigarettes   Smokeless tobacco: Never  Vaping Use   Vaping Use: Never used  Substance and Sexual Activity   Alcohol use: Not Currently   Drug use: Not Currently   Sexual activity: Yes    Partners: Male    Comment: declined condoms - 7.7.2022  Other Topics Concern   Not on file  Social History Narrative   Not on file   Social Determinants of Health   Financial Resource Strain: Not on file  Food Insecurity: Not on file  Transportation Needs: Not on file  Physical Activity: Not on file  Stress: Not on file  Social Connections: Not on file    Labs: Lab Results  Component Value Date   HIV1RNAQUANT NOT DETECTED 04/14/2021   CD4TABS 1,044 04/14/2021    RPR and STI Lab Results  Component Value Date   LABRPR Reactive (A) 01/26/2021   LABRPR Reactive (A) 04/11/2020    STI Results GC CT  04/14/2021 Negative Negative  01/26/2021 Negative Negative  04/11/2020 Negative Negative    Hepatitis B  Lab Results  Component Value Date   HEPBSAB REACTIVE (A) 04/14/2021   HEPBSAG NON-REACTIVE 04/14/2021   HEPBCAB NON-REACTIVE 04/14/2021   Hepatitis C Lab Results  Component Value Date   HEPCAB REACTIVE (A) 04/14/2021   HCVRNAPCRQN <15 NOT DETECTED 04/14/2021   Hepatitis A Lab Results  Component Value Date   HAV REACTIVE (A) 04/14/2021   Lipids: Lab Results  Component Value Date   CHOL 129 04/14/2021   TRIG 180 (H) 04/14/2021   HDL 27 (L) 04/14/2021   CHOLHDL 4.8 04/14/2021   LDLCALC 74 04/14/2021    Current HIV Regimen: Cabenuva 900 mg/ 600 mg   Assessment: Patient presented to clinic today for second Cabenuva injection and second monkeypox vaccine. He stated he had very limited side effects with first Cabenuva injection, and was informed injections would be every 2 months from this point  forward. Patient also states he had no issues with first Jynneos administration and wished to proceed with his second dose. Patient also wished to receive his influenza vaccine, which was administered today (07/12/21) in clinic.   Patient stated he was not concerned for STIs, but wished to proceed with screenings just in case. Previous RPR 01/2021 was 1:4, and was collected today in clinic. Patient also had follow-up appointment re-timed from February to January 2023 to align with his Cabenuva injections.   Plan: - Influenza vaccine IM x1  - Jynneos vaccine #2/2 intradermal x1 - F/U in 2 months for next Cabenuva injection (09/13/21)  - F/U Cytologies (urine, rectal, oral), RPR  - F/U HIV RNA, CD4   Adria Dill, PharmD PGY-1 Acute Care Resident  07/12/2021 3:35 PM

## 2021-07-12 NOTE — Progress Notes (Signed)
   07/12/2021  HPI: Justin [name] is a 40 y.o. male who presents to the [facility] pharmacy clinic for his second monkeypox immunization. Patient was informed he would be fully immunized against monkeypox 2 weeks from 07/12/21.   Mr. Sawyers was observed post immunization for 15 minutes without incident. He was provided with Vaccine Information Sheet and instruction to access the V-Safe system.   Mr. Guderian was instructed to call 911 with any severe reactions post vaccine: Difficulty breathing  Swelling of face and throat  A fast heartbeat  A bad rash all over body  Dizziness and weakness   Jani Gravel, PharmD PGY-1 Acute Care Resident  07/12/2021 4:18 PM    07/12/2021, 4:17 PM

## 2021-07-13 LAB — URINE CYTOLOGY ANCILLARY ONLY
Chlamydia: NEGATIVE
Comment: NEGATIVE
Comment: NORMAL
Neisseria Gonorrhea: NEGATIVE

## 2021-07-13 LAB — CYTOLOGY, (ORAL, ANAL, URETHRAL) ANCILLARY ONLY
Chlamydia: NEGATIVE
Chlamydia: NEGATIVE
Comment: NEGATIVE
Comment: NEGATIVE
Comment: NORMAL
Comment: NORMAL
Neisseria Gonorrhea: NEGATIVE
Neisseria Gonorrhea: NEGATIVE

## 2021-07-14 LAB — RPR: RPR Ser Ql: REACTIVE — AB

## 2021-07-14 LAB — RPR TITER: RPR Titer: 1:4 {titer} — ABNORMAL HIGH

## 2021-07-14 LAB — HIV-1 RNA QUANT-NO REFLEX-BLD
HIV 1 RNA Quant: <20 Copies/mL — ABNORMAL HIGH
HIV-1 RNA Quant, Log: <1.3 Log cps/mL — ABNORMAL HIGH

## 2021-07-14 LAB — FLUORESCENT TREPONEMAL AB(FTA)-IGG-BLD: Fluorescent Treponemal ABS: REACTIVE — AB

## 2021-07-14 LAB — T-HELPER CELL (CD4) - (RCID CLINIC ONLY)
CD4 % Helper T Cell: 34 % (ref 33–65)
CD4 T Cell Abs: 913 /uL (ref 400–1790)

## 2021-07-16 ENCOUNTER — Encounter: Payer: Self-pay | Admitting: Pharmacist

## 2021-08-05 ENCOUNTER — Ambulatory Visit (INDEPENDENT_AMBULATORY_CARE_PROVIDER_SITE_OTHER): Payer: BC Managed Care – PPO | Admitting: Adult Health

## 2021-08-05 ENCOUNTER — Other Ambulatory Visit: Payer: Self-pay

## 2021-08-05 ENCOUNTER — Encounter: Payer: Self-pay | Admitting: Adult Health

## 2021-08-05 DIAGNOSIS — F1010 Alcohol abuse, uncomplicated: Secondary | ICD-10-CM

## 2021-08-05 DIAGNOSIS — F429 Obsessive-compulsive disorder, unspecified: Secondary | ICD-10-CM

## 2021-08-05 DIAGNOSIS — G4700 Insomnia, unspecified: Secondary | ICD-10-CM

## 2021-08-05 DIAGNOSIS — F4310 Post-traumatic stress disorder, unspecified: Secondary | ICD-10-CM

## 2021-08-05 DIAGNOSIS — F339 Major depressive disorder, recurrent, unspecified: Secondary | ICD-10-CM

## 2021-08-05 DIAGNOSIS — F411 Generalized anxiety disorder: Secondary | ICD-10-CM | POA: Diagnosis not present

## 2021-08-05 MED ORDER — MIRTAZAPINE 30 MG PO TABS: 30.0000 mg | ORAL_TABLET | Freq: Every day | ORAL | 5 refills | Status: DC

## 2021-08-05 NOTE — Progress Notes (Signed)
Justin [name] [id number] 04/30/1981 40 y.o.  Subjective:   Patient ID:  Justin [name] is a 40 y.o. (DOB January 28, 1981) male.  Chief Complaint: No chief complaint on file.   HPI Justin [name] presents to the office today for follow-up of MDD, GAD, OCD, and insomnia  Describes mood today as "ok". Pleasant. Tearful at times. Mood symptoms - denies depression and anxiety. Reports irritability - not as constant as it has been. Decreased worry and rumination - "some days". Stating "I'm doing alright.  Has stopped the Remeron and Gabapentin. Uses hydroxyzine as needed. Attends IOP every Wednesday for 1.5 hours. Celebrating 17 days of sobriety. Stable interest and motivation. Taking medications as prescribed.  Energy levels improved. Active, does not have a regular exercise routine. Walking. Enjoys some usual interests and activities. Married. Lives with husband and puppy "[place]". Mother doing well. Spending time with family. Appetite adequate. Weight loss - 6 pounds - 188 from 194 pounds. Sleeping well most nights - having flashbacks. Averages 6 to 8 hours.  Focus and concentration has improved. Completing tasks. Managing aspects of household. Returned to work - based out of [employer]. Denies SI or HI.  Denies AH or VH. Hear someone calling his name sometimes.  Previous medications: Lexapro, Celexa - SSE - 1 and 1/2 years.    [place] Office Visit from 04/29/2021 in [facility]  PHQ-2 Total Score 0      [place] ED from 01/26/2021 in [facility] [place] No Risk        Review of Systems:  Review of Systems  Musculoskeletal:  Negative for gait problem.  Neurological:  Negative for tremors.  Psychiatric/Behavioral:         Please refer to HPI   Medications: I have reviewed the patient's current medications.  Current Outpatient Medications  Medication Sig Dispense Refill    atorvastatin (LIPITOR) 10 MG tablet Take 10 mg by mouth daily.     cabotegravir & rilpivirine ER (CABENUVA) 600 & 900 MG/3ML injection Inject 1 kit into the muscle every 2 (two) months. 6 mL 5   hydrOXYzine (ATARAX/VISTARIL) 25 MG tablet Take 25 mg by mouth 2 (two) times daily as needed.     mirtazapine (REMERON) 30 MG tablet Take 1 tablet (30 mg total) by mouth at bedtime. 30 tablet 5   naltrexone (DEPADE) 50 MG tablet Take 1 tablet (50 mg total) by mouth daily. 30 tablet 2   valACYclovir (VALTREX) 500 MG tablet Take 500 mg by mouth daily.     venlafaxine XR (EFFEXOR-XR) 150 MG 24 hr capsule TAKE 1 CAPSULE BY MOUTH EVERY DAY 90 capsule 1   No current facility-administered medications for this visit.    Medication Side Effects: None  Allergies: No Known Allergies  Past Medical History:  Diagnosis Date   HIV (human immunodeficiency virus infection) ([place])     Past Medical History, Surgical history, Social history, and Family history were reviewed and updated as appropriate.   Please see review of systems for further details on the patient's review from today.   Objective:   Physical Exam:  There were no vitals taken for this visit.  Physical Exam Constitutional:      General: He is not in acute distress. Musculoskeletal:        General: No deformity.  Neurological:     Mental Status: He is alert and oriented to person, place, and time.  Coordination: Coordination normal.  Psychiatric:        Attention and Perception: Attention and perception normal. He does not perceive auditory or visual hallucinations.        Mood and Affect: Mood normal. Mood is not anxious or depressed. Affect is not labile, blunt, angry or inappropriate.        Speech: Speech normal.        Behavior: Behavior normal.        Thought Content: Thought content normal. Thought content is not paranoid or delusional. Thought content does not include homicidal or suicidal ideation. Thought content does not  include homicidal or suicidal plan.        Cognition and Memory: Cognition and memory normal.        Judgment: Judgment normal.     Comments: Insight intact    Lab Review:     Component Value Date/Time   NA 142 04/14/2021 1404   K 4.2 04/14/2021 1404   CL 107 04/14/2021 1404   CO2 25 04/14/2021 1404   GLUCOSE 98 04/14/2021 1404   BUN 12 04/14/2021 1404   CREATININE 1.00 04/14/2021 1404   CALCIUM 9.3 04/14/2021 1404   PROT 7.5 04/14/2021 1404   AST 32 04/14/2021 1404   ALT 52 (H) 04/14/2021 1404   BILITOT 0.3 04/14/2021 1404   GFRNONAA 95 04/14/2021 1404   GFRAA 110 04/14/2021 1404       Component Value Date/Time   WBC 6.9 04/14/2021 1404   RBC 6.06 (H) 04/14/2021 1404   HGB 14.1 04/14/2021 1404   HCT 45.6 04/14/2021 1404   PLT 235 04/14/2021 1404   MCV 75.2 (L) 04/14/2021 1404   MCH 23.3 (L) 04/14/2021 1404   MCHC 30.9 (L) 04/14/2021 1404   RDW 14.5 04/14/2021 1404   LYMPHSABS 2,981 04/14/2021 1404   EOSABS 21 04/14/2021 1404   BASOSABS 28 04/14/2021 1404    No results found for: POCLITH, LITHIUM   No results found for: PHENYTOIN, PHENOBARB, VALPROATE, CBMZ   .res Assessment: Plan:    Plan:  1. Stopped Gabapentin 368m - 2 at hs 2. Stopped Remeron 331mat bedtime - ran out 3. Effexor XR 15049mvery morning 4. Naltrexone 40m83mily 5. Uses as needed - Hydroxyzine 25mg78mneeded  RTC 2/3 months  Patient advised to contact office with any questions, adverse effects, or acute worsening in signs and symptoms.  Diagnoses and all orders for this visit:  PTSD (post-traumatic stress disorder)  GAD (generalized anxiety disorder) -     mirtazapine (REMERON) 30 MG tablet; Take 1 tablet (30 mg total) by mouth at bedtime.  Alcohol abuse  Insomnia, unspecified type  Recurrent major depressive disorder, remission status unspecified (HCC) Munfordsessive-compulsive disorder, unspecified type    Please see After Visit Summary for patient specific  instructions.  Future Appointments  Date Time Provider [place] [place]21/2022 10:45 AM KuppeDarletta Moll-CPP [facility]  11/11/2021 11:00 AM DixonRaleigh CallasRCID-[facility]    No orders of the defined types were placed in this encounter.   -------------------------------

## 2021-08-19 ENCOUNTER — Other Ambulatory Visit: Payer: Self-pay | Admitting: Adult Health

## 2021-08-19 DIAGNOSIS — F411 Generalized anxiety disorder: Secondary | ICD-10-CM

## 2021-09-06 IMAGING — US US ABDOMEN COMPLETE
1 series · 14 of 25 positions shown · non-contrast
Comparison: None.

CLINICAL DATA: Abdominal bloating

EXAM:
ABDOMEN ULTRASOUND COMPLETE

[Series 1: us abdomen complete · 0.23mm/px · 14 of 102 slices shown]
[im 1/102]
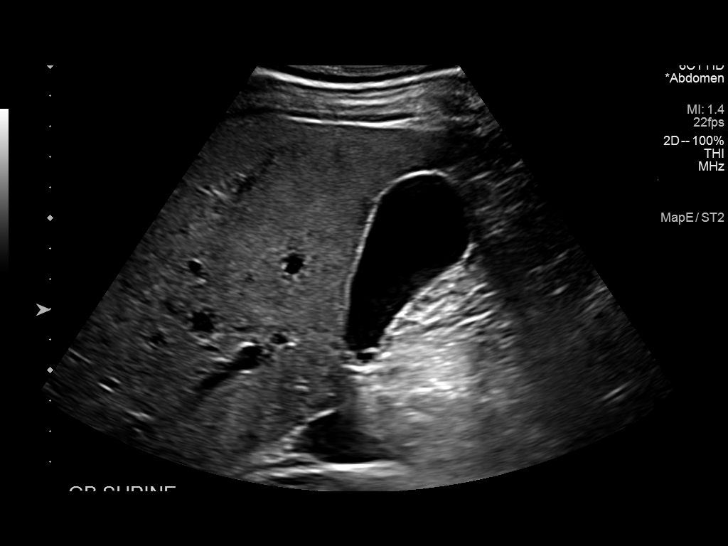
[im 9/102]
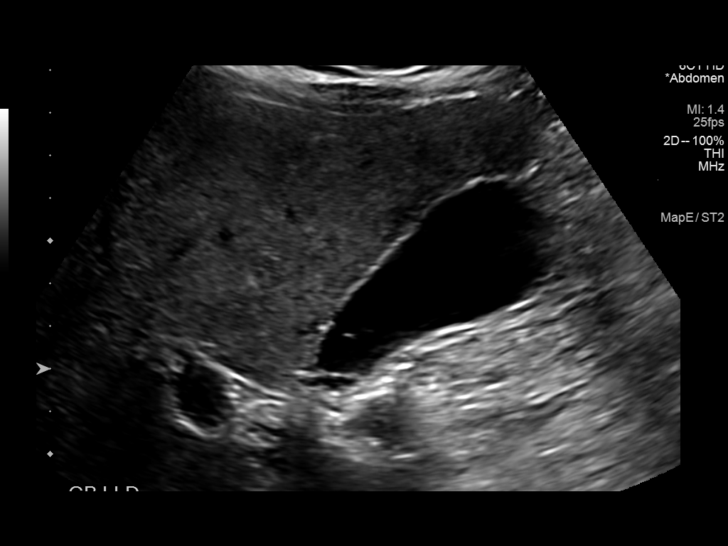
[im 17/102]
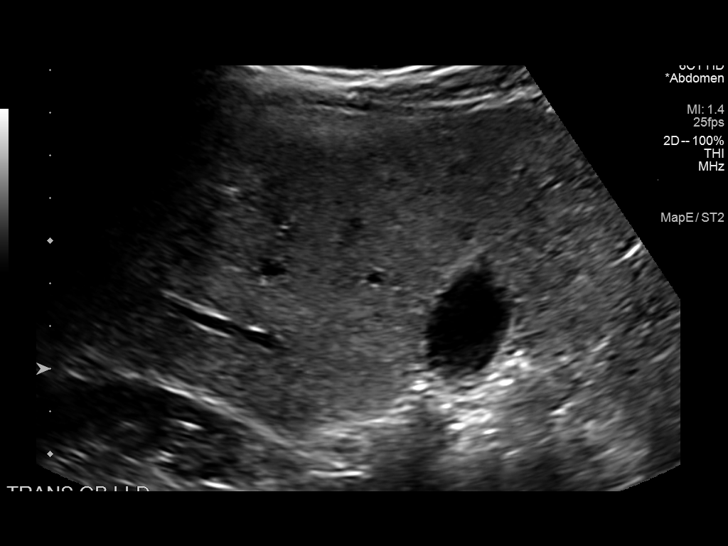
[im 26/102]
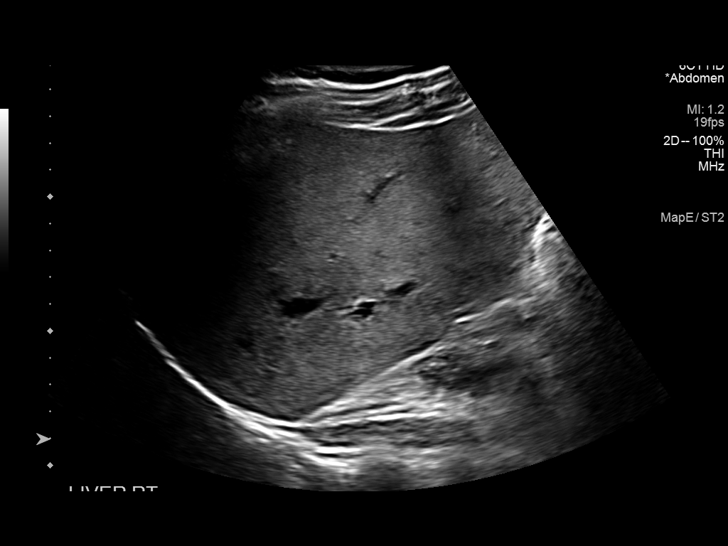
[im 34/102]
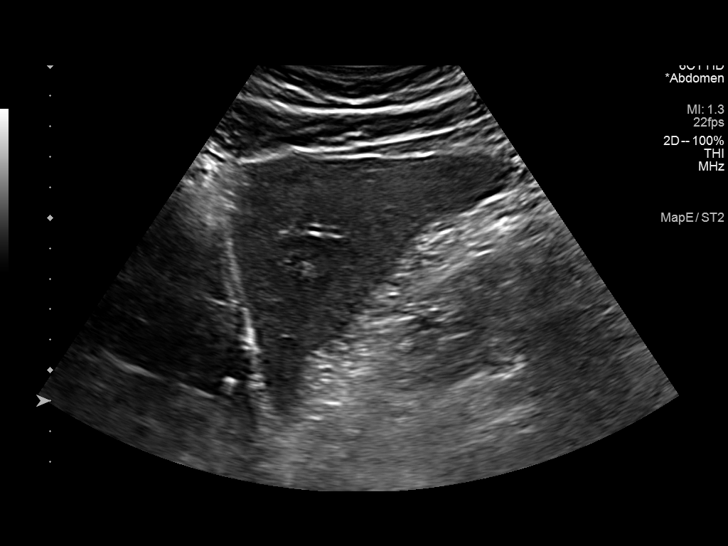
[im 38/102]
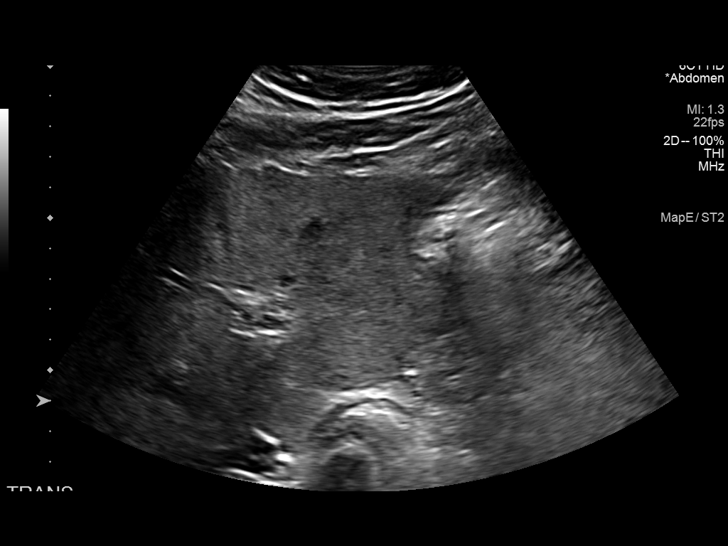
[im 47/102]
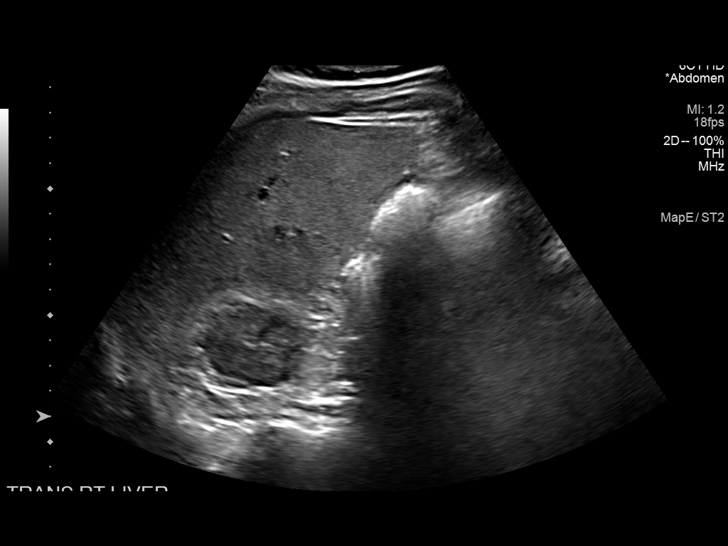
[im 55/102]
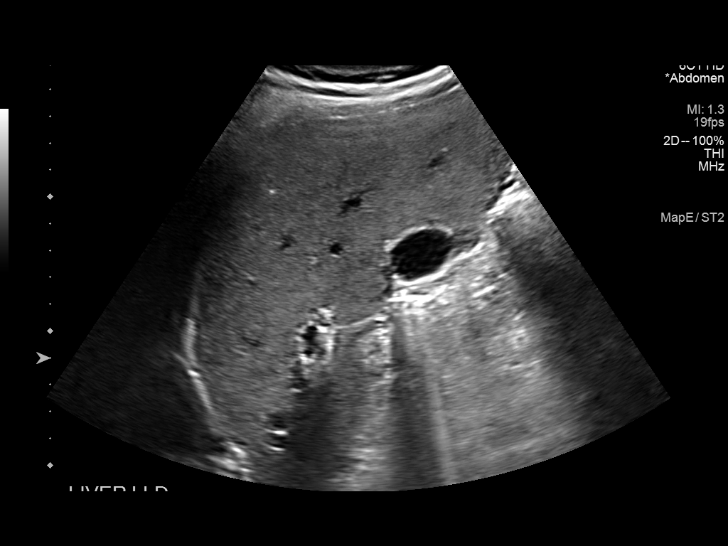
[im 64/102]
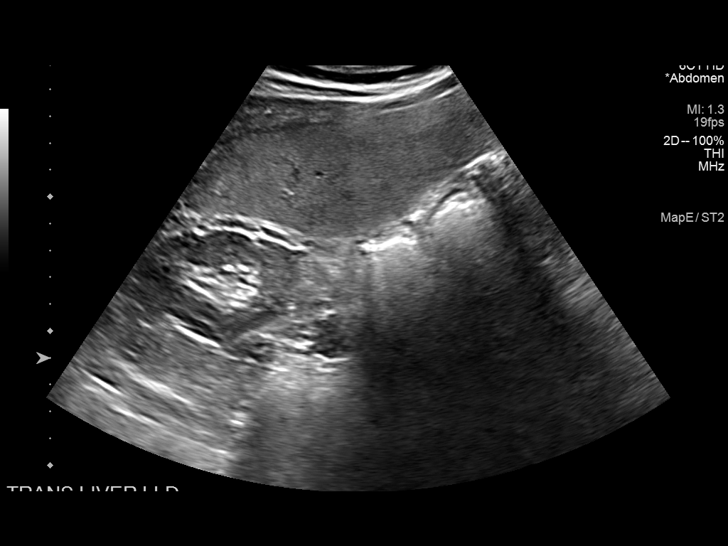
[im 68/102]
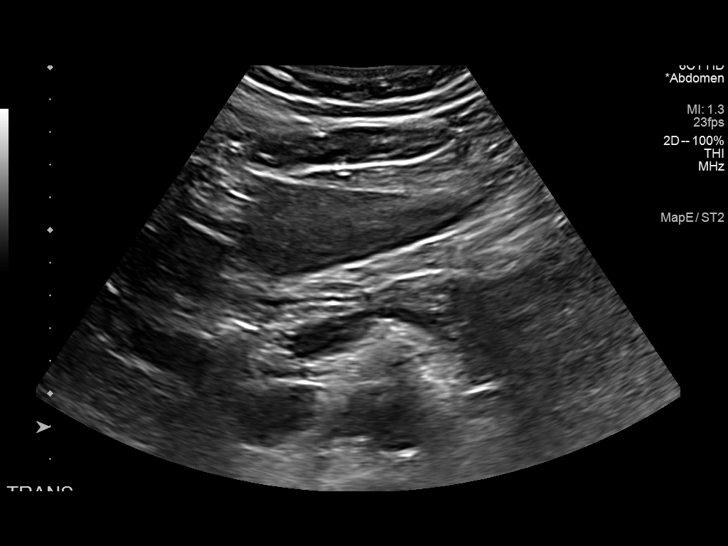
[im 76/102]
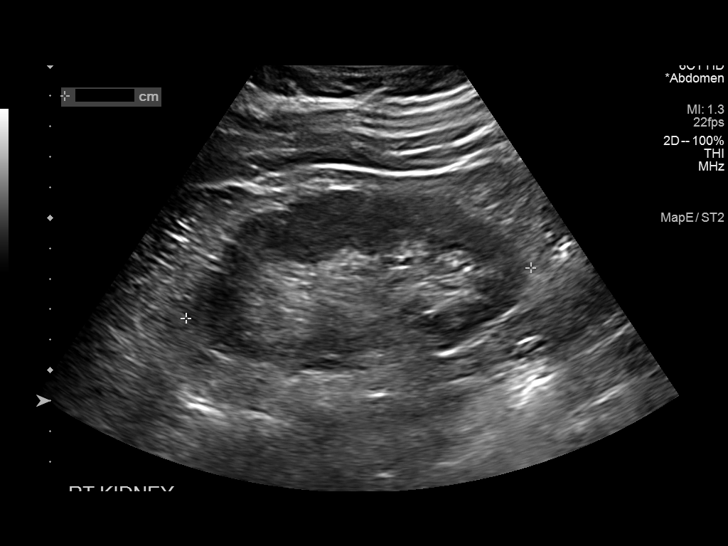
[im 85/102]
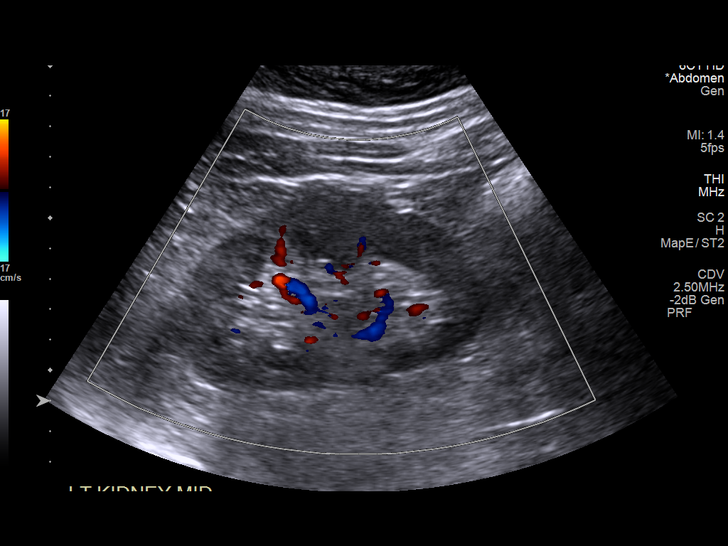
[im 93/102]
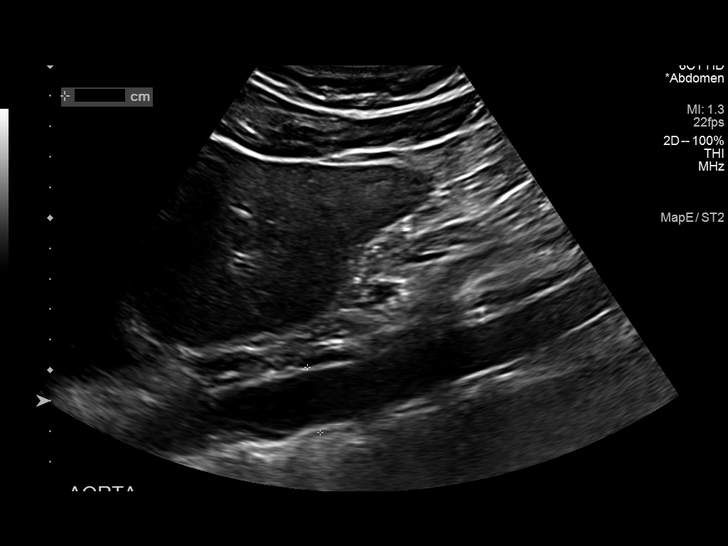
[im 102/102]
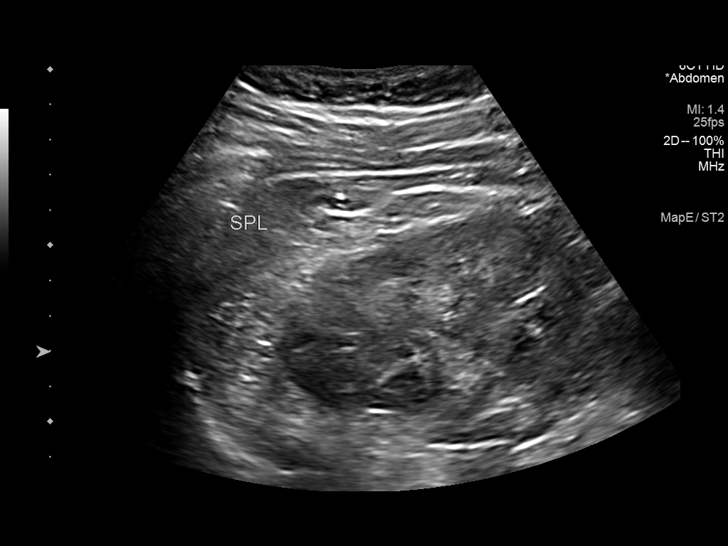

[14 of 25 positions shown; findings below may reference images not displayed]

FINDINGS: Gallbladder: No gallstones or wall thickening visualized. No
sonographic Murphy sign noted by sonographer.

Common bile duct: Diameter: 2.5 mm

Liver: Liver is slightly echogenic. No focal hepatic abnormality.
Portal vein is patent on color Doppler imaging with normal direction
of blood flow towards the liver.

IVC: No abnormality visualized.

Pancreas: Visualized portion unremarkable.

Spleen: Size and appearance within normal limits.

Right Kidney: Length: 11.4 cm. Echogenicity within normal limits. No
mass or hydronephrosis visualized.

Left Kidney: Length: 11.6 cm. Echogenicity within normal limits. No
mass or hydronephrosis visualized.

Abdominal aorta: No aneurysm visualized.

Other findings: None.
IMPRESSION: Echogenic liver consistent with steatosis. Otherwise negative
examination

## 2021-09-13 ENCOUNTER — Telehealth: Payer: Self-pay | Admitting: Student-PharmD

## 2021-09-13 ENCOUNTER — Encounter: Payer: BC Managed Care – PPO | Admitting: Pharmacist

## 2021-09-13 NOTE — Telephone Encounter (Signed)
Called patient regarding missed appointment this morning for Cabenuva administration. He is out of town for work but will be back Wednesday. His target date for Renaldo Harrison is the 18th of the month. He is available to come in Wednesday and is scheduled 11/23 at 10:45am with Marchelle Folks which will still be within his window.

## 2021-09-15 ENCOUNTER — Other Ambulatory Visit (HOSPITAL_COMMUNITY)
Admission: RE | Admit: 2021-09-15 | Discharge: 2021-09-15 | Disposition: A | Discharge status: Home / Self Care | Payer: BC Managed Care – PPO | Source: Ambulatory Visit | Attending: Infectious Diseases | Admitting: Infectious Diseases

## 2021-09-15 ENCOUNTER — Other Ambulatory Visit: Payer: Self-pay

## 2021-09-15 ENCOUNTER — Ambulatory Visit (INDEPENDENT_AMBULATORY_CARE_PROVIDER_SITE_OTHER): Payer: BC Managed Care – PPO | Admitting: Pharmacist

## 2021-09-15 DIAGNOSIS — B20 Human immunodeficiency virus [HIV] disease: Secondary | ICD-10-CM | POA: Diagnosis present

## 2021-09-15 MED ORDER — CABOTEGRAVIR & RILPIVIRINE ER 600 & 900 MG/3ML IM SUER
1.0000 | Freq: Once | INTRAMUSCULAR | Status: AC
  Administered 2021-09-15: 1 via INTRAMUSCULAR

## 2021-09-15 NOTE — Progress Notes (Signed)
HPI: Justin [name] is a 40 y.o. male who presents to the [place] clinic for [place] administration.  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   CABOTEGRAVIR & RILPIVIRINE ER (CABENUVA) 600 & 900 MG/3ML INJECTION    Inject 1 kit into the muscle every 2 (two) months.   HYDROXYZINE (ATARAX/VISTARIL) 25 MG TABLET    Take 25 mg by mouth 2 (two) times daily as needed.   MIRTAZAPINE (REMERON) 30 MG TABLET    TAKE 1 TABLET BY MOUTH AT BEDTIME.   NALTREXONE (DEPADE) 50 MG TABLET    Take 1 tablet (50 mg total) by mouth daily.   VALACYCLOVIR (VALTREX) 500 MG TABLET    Take 500 mg by mouth daily.   VENLAFAXINE XR (EFFEXOR-XR) 150 MG 24 HR CAPSULE    TAKE 1 CAPSULE BY MOUTH EVERY DAY  Modified Medications   No medications on file  Discontinued Medications   No medications on file    Allergies: No Known Allergies  Past Medical History: Past Medical History:  Diagnosis Date   HIV (human immunodeficiency virus infection) ([place])     Social History: Social History   Socioeconomic History   Marital status: Married    Spouse name: Not on file   Number of children: Not on file   Years of education: Not on file   Highest education level: Not on file  Occupational History   Not on file  Tobacco Use   Smoking status: Every Day    Types: E-cigarettes   Smokeless tobacco: Never  Vaping Use   Vaping Use: Never used  Substance and Sexual  Activity   Alcohol use: Not Currently   Drug use: Not Currently   Sexual activity: Yes    Partners: Male    Comment: declined condoms - 7.7.2022  Other Topics Concern   Not on file  Social History Narrative   Not on file   Social Determinants of Health   Financial Resource Strain: Not on file  Food Insecurity: Not on file  Transportation Needs: Not on file  Physical Activity: Not on file  Stress: Not on file  Social Connections: Not on file    Labs: Lab Results  Component Value Date   HIV1RNAQUANT <20 (H) 07/12/2021   HIV1RNAQUANT NOT DETECTED 04/14/2021   CD4TABS 913 07/12/2021   CD4TABS 1,044 04/14/2021    RPR and STI Lab Results  Component Value Date   LABRPR REACTIVE (A) 07/12/2021   LABRPR Reactive (A) 01/26/2021   LABRPR Reactive (A) 04/11/2020   RPRTITER 1:4 (H) 07/12/2021    STI Results GC CT  07/12/2021 Negative Negative  07/12/2021 Negative Negative  07/12/2021 Negative Negative  04/14/2021 Negative Negative  01/26/2021 Negative Negative  04/11/2020 Negative Negative    Hepatitis B Lab Results  Component Value Date   HEPBSAB REACTIVE (A) 04/14/2021   HEPBSAG NON-REACTIVE 04/14/2021   HEPBCAB NON-REACTIVE 04/14/2021   Hepatitis C Lab Results  Component Value  Date   HEPCAB REACTIVE (A) 04/14/2021   HCVRNAPCRQN <15 NOT DETECTED 04/14/2021   Hepatitis A Lab Results  Component Value Date   HAV REACTIVE (A) 04/14/2021   Lipids: Lab Results  Component Value Date   CHOL 129 04/14/2021   TRIG 180 (H) 04/14/2021   HDL 27 (L) 04/14/2021   CHOLHDL 4.8 04/14/2021   LDLCALC 74 04/14/2021    TARGET DATE:  The 18th of the month  Current HIV Regimen: Cabenuva  Assessment: Justin [name] presents today for their maintenance Cabenuva injections. Initial/past injections were tolerated well without issues. No problems with systemic effects of injections. Patient did experience mild soreness but subsided after a few days.   Administered cabotegravir 669m/3mL  in left upper outer quadrant of the gluteal muscle. Administered rilpivirine 900 mg/318min the right upper outer quadrant of the gluteal muscle. Monitored patient for 10 minutes after injection. Injections were tolerated well without issue. Patient will follow up in 2 months for next injection.  Plan: - Cabenuva injections administered - Next injections scheduled for 11/11/21 @ 11:00 with [place]ta [place] Call with any issues or questions  AlLestine BoxPharmD PGY2 Infectious Diseases Pharmacy Resident

## 2021-09-17 LAB — URINE CYTOLOGY ANCILLARY ONLY
Chlamydia: NEGATIVE
Comment: NEGATIVE
Comment: NORMAL
Neisseria Gonorrhea: NEGATIVE

## 2021-09-19 LAB — HIV-1 RNA QUANT-NO REFLEX-BLD
HIV 1 RNA Quant: NOT DETECTED Copies/mL
HIV-1 RNA Quant, Log: NOT DETECTED Log cps/mL

## 2021-11-01 ENCOUNTER — Other Ambulatory Visit: Payer: BC Managed Care – PPO

## 2021-11-04 ENCOUNTER — Ambulatory Visit: Payer: BC Managed Care – PPO | Admitting: Adult Health

## 2021-11-11 ENCOUNTER — Ambulatory Visit (INDEPENDENT_AMBULATORY_CARE_PROVIDER_SITE_OTHER): Payer: BC Managed Care – PPO | Admitting: Infectious Diseases

## 2021-11-11 ENCOUNTER — Encounter: Payer: Self-pay | Admitting: Infectious Diseases

## 2021-11-11 ENCOUNTER — Other Ambulatory Visit: Payer: Self-pay

## 2021-11-11 VITALS — BP 119/82 | HR 67 | Temp 97.7°F | Ht 68.0 in | Wt 183.0 lb

## 2021-11-11 DIAGNOSIS — B20 Human immunodeficiency virus [HIV] disease: Secondary | ICD-10-CM

## 2021-11-11 DIAGNOSIS — F4310 Post-traumatic stress disorder, unspecified: Secondary | ICD-10-CM | POA: Diagnosis not present

## 2021-11-11 DIAGNOSIS — F331 Major depressive disorder, recurrent, moderate: Secondary | ICD-10-CM | POA: Diagnosis not present

## 2021-11-11 MED ORDER — CABOTEGRAVIR & RILPIVIRINE ER 600 & 900 MG/3ML IM SUER
1.0000 | Freq: Once | INTRAMUSCULAR | Status: AC
  Administered 2021-11-11: 1 via INTRAMUSCULAR

## 2021-11-11 NOTE — Progress Notes (Signed)
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 male with HIV dx 04/11/2007. Started medications in 2009.  In care with [facility] 2016 - 2021.  CD4 nadir > 200 VL < 20  HIV Risk: sexual, msm History of OIs: none Intake Labs 03/2021: Hep B sAg (-), sAb (+), cAb (-); Hep A (+), Hep C (+, RNA - 03/2021) Quantiferon (-) HLA B*5701 (-) G6PD: ()   Previous Regimens: Atripla 2009 - 2016 Triumeq, 2017 - 2022  Cabenuva 05/2021    Genotypes: None on file  Subjective:   Chief Complaint  Patient presents with   Follow-up    Declined condoms      HPI: Justin [name] is here for follow up for HIV. He has started [place] q43minjections and today will be his 4th dose. He has had some knots occasionally, left more than right.   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 Still has some urges/desires but has worked through these with the help of counseling services.   Depression / PTSD - Has been worsening since April. Nightmares are worse over the last few months. These cause him to have poor sleep where he only gets 3-4 hours a time some nights. Passive thoughts about suicide but no plan / intent at this time. [place] [facility] [place] [facility] [place] Recently increased his dose of effexor. Contracts for safety.   Feels almost a tightness / squeezing in the chest occasionally where he has to catch his breath.  This has been for about 6 months.     Depression screen PHQ 2/9 11/11/2021  Decreased Interest 3  Down, Depressed, Hopeless 2  PHQ - 2 Score 5  Altered sleeping 3  Tired, decreased energy 3  Change in appetite 3  Feeling bad or failure about yourself  3  Trouble concentrating 2  Moving slowly or fidgety/restless 1  Suicidal thoughts 1  PHQ-9 Score 21  Difficult doing work/chores Very difficult     Review of Systems  Constitutional:  Negative for chills, fever, malaise/fatigue and weight loss.  HENT:  Negative for sore throat.   Respiratory:  Negative for cough, sputum production and shortness of breath.   Cardiovascular: Negative.   Gastrointestinal:  Negative for abdominal pain, diarrhea and vomiting.  Musculoskeletal:  Negative for joint pain, myalgias and neck pain.  Skin:  Negative for rash.  Neurological:  Negative for headaches.  Psychiatric/Behavioral:  Negative for depression and substance abuse. The patient is not nervous/anxious.   All other systems reviewed and are negative.   Past Medical History:  Diagnosis Date   HIV (human immunodeficiency virus infection) ([place]     Outpatient Medications Prior to Visit  Medication Sig Dispense Refill   atorvastatin (LIPITOR) 10 MG tablet Take 10 mg by mouth daily.     cabotegravir & rilpivirine ER (CABENUVA) 600 & 900 MG/3ML injection Inject 1 kit into the muscle every 2 (two) months.  6 mL 5   hydrOXYzine (ATARAX/VISTARIL) 25 MG tablet Take 25 mg by mouth 2 (two) times daily as needed.     traZODone (DESYREL) 100 MG tablet TAKE ONE TABLET BY MOUTH AT BEDTIME MAY REPEAT SAME DOSE ONE TIME IF STILL AWAKE AN HOUR AFTER INITIAL DOSE.     valACYclovir (VALTREX) 500 MG tablet Take 500 mg by mouth daily.     venlafaxine XR (EFFEXOR-XR) 150 MG 24 hr capsule TAKE 1 CAPSULE BY MOUTH EVERY DAY 90 capsule 1   naltrexone (DEPADE) 50 MG tablet Take 1 tablet (50 mg total) by mouth daily. (Patient not taking: Reported on 11/11/2021) 30  tablet 2   mirtazapine (REMERON) 30 MG tablet TAKE 1 TABLET BY MOUTH AT BEDTIME. (Patient not taking: Reported on 11/11/2021) 90 tablet 0   No facility-administered medications prior to visit.     No Known Allergies  Social History   Tobacco Use   Smoking status: Every Day    Types: E-cigarettes   Smokeless tobacco: Never  Vaping Use   Vaping Use: Never used  Substance Use Topics   Alcohol use: Not Currently   Drug use: Not Currently     Social History   Substance and Sexual Activity  Sexual Activity Yes   Partners: Male   Comment: declined condoms - 10/2021     Objective:   Vitals:   11/11/21 1108  BP: 119/82  Pulse: 67  Temp: 97.7 F (36.5 C)  TempSrc: Oral  SpO2: 100%  Weight: 183 lb (83 kg)  Height: '5\' 8"'  (1.727 m)   Body mass index is 27.83 kg/m.  Physical Exam Vitals reviewed.  Constitutional:      Appearance: He is well-developed.     Comments: Seated comfortably in chair during visit.   HENT:     Mouth/Throat:     Dentition: Normal dentition. No dental abscesses.  Cardiovascular:     Rate and Rhythm: Normal rate and regular rhythm.     Heart sounds: Normal heart sounds.  Pulmonary:     Effort: Pulmonary effort is normal.     Breath sounds: Normal breath sounds.  Abdominal:     General: There is no distension.     Palpations: Abdomen is soft.     Tenderness: There is no abdominal tenderness.  Lymphadenopathy:     Cervical: No cervical adenopathy.  Skin:    General: Skin is warm and dry.     Findings: No rash.  Neurological:     Mental Status: He is alert and oriented to person, place, and time.  Psychiatric:        Judgment: Judgment normal.     Comments: In good spirits today and engaged in care discussion.     Lab Results Lab Results  Component Value Date   WBC 6.9 04/14/2021   HGB 14.1 04/14/2021   HCT 45.6 04/14/2021   MCV 75.2 (L) 04/14/2021   PLT 235 04/14/2021    Lab Results  Component Value Date   CREATININE 1.00  04/14/2021   BUN 12 04/14/2021   NA 142 04/14/2021   K 4.2 04/14/2021   CL 107 04/14/2021   CO2 25 04/14/2021    Lab Results  Component Value Date   ALT 52 (H) 04/14/2021   AST 32 04/14/2021   BILITOT 0.3 04/14/2021    Lab Results  Component Value Date   CHOL 129 04/14/2021   HDL 27 (L) 04/14/2021   LDLCALC 74 04/14/2021   TRIG 180 (H) 04/14/2021  CHOLHDL 4.8 04/14/2021   HIV 1 RNA Quant  Date Value  09/15/2021 Not Detected Copies/mL  07/12/2021 <20 Copies/mL (H)  04/14/2021 NOT DETECTED copies/mL   CD4 T Cell Abs (/uL)  Date Value  07/12/2021 913  04/14/2021 1,044     Assessment & Plan:   Problem List Items Addressed This Visit       Unprioritized   HIV infection ([place])    Doing well with minor injection site reactions to cabenuva - will give 4th dose today and check VL. I have scheduled out his next 3 appointments.  We did discuss my concern that cabotegravir may contribute to worsening depression. Not sure it is completely responsible for nightmare / PTSD symptoms however - I am comfortable continuing for him with monitoring and assistance from psychiatry team. He was comfortable with this plan today.  Immunizations are all up to date - menveo booster in October 2023.        PTSD (post-traumatic stress disorder)    Following closely with psychiatry and counseling team with [place].       Relevant Medications   traZODone (DESYREL) 100 MG tablet   Major depressive disorder, recurrent, moderate ([place])    Working with psychiatry -  I also made sure he had the Mobile Crisis Response phone number in the event this worsens acutely.       Relevant Medications   traZODone (DESYREL) 100 MG tablet   Other Visit Diagnoses     HIV disease ([place])    -  Primary   Relevant Orders   HIV 1 RNA quant-no reflex-bld      Janene Madeira, MSN, NP-C [place] for [facility] [place] Pager: [phone] Office: [phone]   11/11/21   11:49 AM

## 2021-11-11 NOTE — Assessment & Plan Note (Signed)
Doing well with minor injection site reactions to cabenuva - will give 4th dose today and check VL. I have scheduled out his next 3 appointments.  We did discuss my concern that cabotegravir may contribute to worsening depression. Not sure it is completely responsible for nightmare / PTSD symptoms however - I am comfortable continuing for him with monitoring and assistance from psychiatry team. He was comfortable with this plan today.  Immunizations are all up to date - menveo booster in October 2023.

## 2021-11-11 NOTE — Assessment & Plan Note (Signed)
Working with psychiatry -  I also made sure he had the Mobile Crisis Response phone number in the event this worsens acutely.

## 2021-11-11 NOTE — Assessment & Plan Note (Signed)
[facility].

## 2021-11-11 NOTE — Patient Instructions (Addendum)
So nice to see you, thank you for talking with me today.   Just so you have the information if you need it -  [facility] Crisis [facility] provides immediate crisis response 24 hours a day, 7 days a week [phone] They come to you and respond within 2 hours.   Please stop by the lab on your way out to check your viral load again.  We can start spacing out these checks to every 4 months after your next appointment with good confidence it is working well for you.

## 2021-11-12 ENCOUNTER — Other Ambulatory Visit (HOSPITAL_COMMUNITY): Payer: Self-pay

## 2021-11-13 LAB — HIV-1 RNA QUANT-NO REFLEX-BLD
HIV 1 RNA Quant: NOT DETECTED Copies/mL
HIV-1 RNA Quant, Log: NOT DETECTED Log cps/mL

## 2021-11-18 ENCOUNTER — Ambulatory Visit: Payer: BC Managed Care – PPO | Admitting: Adult Health

## 2021-11-29 ENCOUNTER — Encounter: Payer: BC Managed Care – PPO | Admitting: Infectious Diseases

## 2022-01-07 ENCOUNTER — Other Ambulatory Visit: Payer: Self-pay

## 2022-01-07 ENCOUNTER — Ambulatory Visit (INDEPENDENT_AMBULATORY_CARE_PROVIDER_SITE_OTHER): Payer: BC Managed Care – PPO | Admitting: Pharmacist

## 2022-01-07 DIAGNOSIS — Z23 Encounter for immunization: Secondary | ICD-10-CM

## 2022-01-07 DIAGNOSIS — B20 Human immunodeficiency virus [HIV] disease: Secondary | ICD-10-CM

## 2022-01-07 MED ORDER — CABOTEGRAVIR & RILPIVIRINE ER 600 & 900 MG/3ML IM SUER
1.0000 | Freq: Once | INTRAMUSCULAR | Status: AC
  Administered 2022-01-07: 1 via INTRAMUSCULAR

## 2022-01-07 NOTE — Progress Notes (Signed)
? ?HPI: Justin [name] is a 41 y.o. male who presents to the [facility] for [place] administration. ? ?Patient Active Problem List  ? Diagnosis Date Noted  ? Heartburn 04/29/2021  ? Herpes simplex 04/29/2021  ? 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 Major depressive disorder, recurrent, moderate ([place]) 04/29/2021  ? Hepatitis C virus infection cured after antiviral drug therapy 04/29/2021  ? HIV infection ([place]) 07/18/2019  ? PTSD (post-traumatic stress disorder) 07/18/2019  ? Colitis 11/07/2014  ? Family history of colonic polyps 09/29/2014  ? ? ?Patient's Medications  ?New Prescriptions  ? No medications on file  ?Previous Medications  ? ATORVASTATIN (LIPITOR) 10 MG TABLET    Take 10 mg by mouth daily.  ? CABOTEGRAVIR & RILPIVIRINE ER (CABENUVA) 600 & 900 MG/3ML INJECTION    Inject 1 kit into the muscle every 2 (two) months.  ? HYDROXYZINE (ATARAX/VISTARIL) 25 MG TABLET    Take 25 mg by mouth 2 (two) times daily as needed.  ? NALTREXONE (DEPADE) 50 MG TABLET    Take 1 tablet (50 mg total) by mouth daily.  ? TRAZODONE (DESYREL) 100 MG TABLET    TAKE ONE TABLET BY MOUTH AT BEDTIME MAY REPEAT SAME DOSE ONE TIME IF STILL AWAKE AN HOUR AFTER INITIAL DOSE.  ? VALACYCLOVIR (VALTREX) 500 MG TABLET    Take 500 mg by mouth daily.  ? VENLAFAXINE XR (EFFEXOR-XR) 150 MG 24 HR CAPSULE    TAKE 1 CAPSULE BY MOUTH EVERY DAY  ?Modified Medications  ? No medications on file  ?Discontinued Medications  ? No medications on file  ? ? ?Allergies: ?No Known Allergies ? ?Past Medical History: ?Past Medical History:  ?Diagnosis Date  ? HIV (human immunodeficiency virus infection) ([place])   ? ? ?Social History: ?Social History  ? ?Socioeconomic History  ? Marital status: Married  ?  Spouse name: Not on file  ? Number of children: Not on file  ? Years of education: Not on file  ? Highest education level: Not on file  ?Occupational History  ? Not on file  ?Tobacco Use  ? Smoking status: Every Day  ?  Types: E-cigarettes  ? Smokeless tobacco:  Never  ?Vaping Use  ? Vaping Use: Never used  ?Substance and Sexual Activity  ? Alcohol use: Not Currently  ? Drug use: Not Currently  ? Sexual activity: Yes  ?  Partners: Male  ?  Comment: declined condoms - 10/2021  ?Other Topics Concern  ? Not on file  ?Social History Narrative  ? Not on file  ? ?Social Determinants of Health  ? ?Financial Resource Strain: Not on file  ?Food Insecurity: Not on file  ?Transportation Needs: Not on file  ?Physical Activity: Not on file  ?Stress: Not on file  ?Social Connections: Not on file  ? ? ?Labs: ?Lab Results  ?Component Value Date  ? HIV1RNAQUANT Not Detected 11/11/2021  ? HIV1RNAQUANT Not Detected 09/15/2021  ? HIV1RNAQUANT <20 (H) 07/12/2021  ? CD4TABS 913 07/12/2021  ? CD4TABS 1,044 04/14/2021  ? ? ?RPR and STI ?Lab Results  ?Component Value Date  ? LABRPR REACTIVE (A) 07/12/2021  ? LABRPR Reactive (A) 01/26/2021  ? LABRPR Reactive (A) 04/11/2020  ? RPRTITER 1:4 (H) 07/12/2021  ? ? ?STI Results GC CT  ?09/15/2021 Negative Negative  ?07/12/2021 Negative Negative  ?07/12/2021 Negative Negative  ?07/12/2021 Negative Negative  ?04/14/2021 Negative Negative  ?01/26/2021 Negative Negative  ?04/11/2020 Negative Negative  ? ? ?Hepatitis B ?Lab Results  ?Component Value Date  ?  HEPBSAB REACTIVE (A) 04/14/2021  ? HEPBSAG NON-REACTIVE 04/14/2021  ? HEPBCAB NON-REACTIVE 04/14/2021  ? ?Hepatitis C ?Lab Results  ?Component Value Date  ? HEPCAB REACTIVE (A) 04/14/2021  ? YNXGZFPOIPP <15 NOT DETECTED 04/14/2021  ? ?Hepatitis A ?Lab Results  ?Component Value Date  ? HAV REACTIVE (A) 04/14/2021  ? ?Lipids: ?Lab Results  ?Component Value Date  ? CHOL 129 04/14/2021  ? TRIG 180 (H) 04/14/2021  ? HDL 27 (L) 04/14/2021  ? CHOLHDL 4.8 04/14/2021  ? [place] 74 04/14/2021  ? ? ?TARGET DATE:  The 18th of the month ? ?Current HIV Regimen: ?Cabenuva ? ?Assessment: ?Justin [name] presents today for their maintenance Cabenuva injections. Initial/past injections were tolerated well without issues. No problems with  systemic effects of injections. Patient did experience some nodules and more soreness on his left side. Patient did tense on left side. Consider switching sides next time.   ? ?Administered cabotegravir 626m/3mL in left upper outer quadrant of the gluteal muscle. Administered rilpivirine 900 mg/311min the right upper outer quadrant of the gluteal muscle. Monitored patient for 10 minutes after injection. Injections were tolerated well without issue. Patient will follow up in 2 months for next injection. ? ?BaHamlintates he has no new partners: got married this last year but consented to STI testing.  ? ?Immunizations: HPV (Next dose at next appointment in May) ? ?Plan: ?- Cabenuva injections administered ?- Next injections scheduled for 5/19 and 7/24 ?- F/U HIV RNA, RPR, and cytologies (oral/rectal/urine) ?- Next HPV dose 5/19 ?- Call with any issues or questions ? ?Thank you for allowing pharmacy to be apart of this patient's care ? ?AnAsher MuirPharmD Candidate ?

## 2022-01-08 LAB — GC/CHLAMYDIA PROBE, AMP (THROAT)
Chlamydia trachomatis RNA: NOT DETECTED
Neisseria gonorrhoeae RNA: NOT DETECTED

## 2022-01-08 LAB — CT/NG RNA, TMA RECTAL
Chlamydia Trachomatis RNA: NOT DETECTED
Neisseria Gonorrhoeae RNA: NOT DETECTED

## 2022-01-08 LAB — C. TRACHOMATIS/N. GONORRHOEAE RNA
C. trachomatis RNA, TMA: NOT DETECTED
N. gonorrhoeae RNA, TMA: NOT DETECTED

## 2022-01-10 LAB — RPR TITER: RPR Titer: 1:2 {titer} — ABNORMAL HIGH

## 2022-01-10 LAB — FLUORESCENT TREPONEMAL AB(FTA)-IGG-BLD: Fluorescent Treponemal ABS: REACTIVE — AB

## 2022-01-10 LAB — HIV-1 RNA QUANT-NO REFLEX-BLD
HIV 1 RNA Quant: NOT DETECTED Copies/mL
HIV-1 RNA Quant, Log: NOT DETECTED Log cps/mL

## 2022-01-10 LAB — RPR: RPR Ser Ql: REACTIVE — AB

## 2022-03-11 ENCOUNTER — Other Ambulatory Visit: Payer: Self-pay

## 2022-03-11 ENCOUNTER — Ambulatory Visit (INDEPENDENT_AMBULATORY_CARE_PROVIDER_SITE_OTHER): Payer: BC Managed Care – PPO | Admitting: Pharmacist

## 2022-03-11 DIAGNOSIS — Z23 Encounter for immunization: Secondary | ICD-10-CM

## 2022-03-11 DIAGNOSIS — B20 Human immunodeficiency virus [HIV] disease: Secondary | ICD-10-CM

## 2022-03-11 MED ORDER — CABOTEGRAVIR & RILPIVIRINE ER 600 & 900 MG/3ML IM SUER
1.0000 | Freq: Once | INTRAMUSCULAR | Status: AC
  Administered 2022-03-11: 1 via INTRAMUSCULAR

## 2022-03-11 NOTE — Progress Notes (Signed)
HPI: Justin [name] is a 41 y.o. male who presents to the [facility] for [place] administration.  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   CABOTEGRAVIR & RILPIVIRINE ER (CABENUVA) 600 & 900 MG/3ML INJECTION    Inject 1 kit into the muscle every 2 (two) months.   HYDROXYZINE (ATARAX/VISTARIL) 25 MG TABLET    Take 25 mg by mouth 2 (two) times daily as needed.   NALTREXONE (DEPADE) 50 MG TABLET    Take 1 tablet (50 mg total) by mouth daily.   TRAZODONE (DESYREL) 100 MG TABLET    TAKE ONE TABLET BY MOUTH AT BEDTIME MAY REPEAT SAME DOSE ONE TIME IF STILL AWAKE AN HOUR AFTER INITIAL DOSE.   VALACYCLOVIR (VALTREX) 500 MG TABLET    Take 500 mg by mouth daily.   VENLAFAXINE XR (EFFEXOR-XR) 150 MG 24 HR CAPSULE    TAKE 1 CAPSULE BY MOUTH EVERY DAY  Modified Medications   No medications on file  Discontinued Medications   No medications on file    Allergies: No Known Allergies  Past Medical History: Past Medical History:  Diagnosis Date   HIV (human immunodeficiency virus infection) ([place])     Social History: Social History   Socioeconomic History   Marital status: Married    Spouse name: Not on file   Number of children: Not on file   Years of education: Not on file   Highest education level: Not on file  Occupational History   Not on file  Tobacco Use   Smoking status: Every Day    Types: E-cigarettes   Smokeless tobacco:  Never  Vaping Use   Vaping Use: Never used  Substance and Sexual Activity   Alcohol use: Not Currently   Drug use: Not Currently   Sexual activity: Yes    Partners: Male    Comment: declined condoms - 10/2021  Other Topics Concern   Not on file  Social History Narrative   Not on file   Social Determinants of Health   Financial Resource Strain: Not on file  Food Insecurity: Not on file  Transportation Needs: Not on file  Physical Activity: Not on file  Stress: Not on file  Social Connections: Not on file    Labs: Lab Results  Component Value Date   HIV1RNAQUANT Not Detected 01/07/2022   HIV1RNAQUANT Not Detected 11/11/2021   HIV1RNAQUANT Not Detected 09/15/2021   CD4TABS 913 07/12/2021   CD4TABS 1,044 04/14/2021    RPR and STI Lab Results  Component Value Date   LABRPR REACTIVE (A) 01/07/2022   LABRPR REACTIVE (A) 07/12/2021   LABRPR Reactive (A) 01/26/2021   LABRPR Reactive (A) 04/11/2020   RPRTITER 1:2 (H) 01/07/2022   RPRTITER 1:4 (H) 07/12/2021    STI Results GC CT  09/15/2021 11:20 AM Negative   Negative    07/12/2021  3:41 PM Negative     Negative     Negative  Negative     Negative     Negative    04/14/2021  3:52 PM Negative   Negative    01/26/2021  2:32 PM Negative   Negative    04/11/2020 11:04 AM Negative   Negative      Hepatitis B Lab Results  Component Value Date   HEPBSAB REACTIVE (A) 04/14/2021   HEPBSAG NON-REACTIVE 04/14/2021   HEPBCAB NON-REACTIVE 04/14/2021   Hepatitis C Lab Results  Component Value Date   HEPCAB REACTIVE (A) 04/14/2021   HCVRNAPCRQN <15 NOT DETECTED 04/14/2021   Hepatitis A Lab Results  Component Value Date   HAV REACTIVE (A) 04/14/2021   Lipids: Lab Results  Component Value Date   CHOL 129 04/14/2021   TRIG 180 (H) 04/14/2021   HDL 27 (L) 04/14/2021   CHOLHDL 4.8 04/14/2021   LDLCALC 74 04/14/2021    TARGET DATE: 18th of the month  Assessment: Lamine presents today for their maintenance  Cabenuva injections. Past injections were tolerated well without issues. No problems with systemic side effects of injections. Patient did experience some soreness that's getting better with each of the injections. He also got his second HPV vaccine and his Menveo booster. No new sexual partners and no symptoms of STIs so he politely declined STI testing at this time.  Administered cabotegravir 69m/3mL in left upper outer quadrant of the gluteal muscle. Administered rilpivirine 900 mg/357min the right upper outer quadrant of the gluteal muscle. Injections were tolerated well. Patient will follow up in 2 months for next set of injections.  Plan: - Cabenuva injections administered - Next injections scheduled for 7/24 with Dr. DiDoren Custard Call with any issues or questions - Administered HPV vaccine (next injection due 06/2022) and Menveo booster (next injection due 2028)  MaVarney DailyPharmD PGY1 Pharmacy Resident [facility]

## 2022-03-15 LAB — HIV-1 RNA QUANT-NO REFLEX-BLD
HIV 1 RNA Quant: NOT DETECTED copies/mL
HIV-1 RNA Quant, Log: NOT DETECTED Log copies/mL

## 2022-04-19 ENCOUNTER — Encounter: Payer: Self-pay | Admitting: Cardiology

## 2022-04-19 ENCOUNTER — Ambulatory Visit: Payer: BC Managed Care – PPO | Admitting: Cardiology

## 2022-04-19 VITALS — BP 122/79 | HR 76 | Temp 98.1°F | Resp 16 | Ht 68.0 in | Wt 182.4 lb

## 2022-04-19 DIAGNOSIS — B20 Human immunodeficiency virus [HIV] disease: Secondary | ICD-10-CM

## 2022-04-19 DIAGNOSIS — R072 Precordial pain: Secondary | ICD-10-CM

## 2022-04-19 DIAGNOSIS — R0609 Other forms of dyspnea: Secondary | ICD-10-CM

## 2022-04-19 DIAGNOSIS — E782 Mixed hyperlipidemia: Secondary | ICD-10-CM

## 2022-04-19 DIAGNOSIS — F17290 Nicotine dependence, other tobacco product, uncomplicated: Secondary | ICD-10-CM

## 2022-05-16 ENCOUNTER — Other Ambulatory Visit: Payer: Self-pay

## 2022-05-16 ENCOUNTER — Ambulatory Visit (INDEPENDENT_AMBULATORY_CARE_PROVIDER_SITE_OTHER): Payer: BC Managed Care – PPO | Admitting: Infectious Diseases

## 2022-05-16 ENCOUNTER — Encounter: Payer: Self-pay | Admitting: Infectious Diseases

## 2022-05-16 DIAGNOSIS — L299 Pruritus, unspecified: Secondary | ICD-10-CM

## 2022-05-16 DIAGNOSIS — R7303 Prediabetes: Secondary | ICD-10-CM | POA: Diagnosis not present

## 2022-05-16 DIAGNOSIS — B20 Human immunodeficiency virus [HIV] disease: Secondary | ICD-10-CM | POA: Diagnosis not present

## 2022-05-16 MED ORDER — BETAMETHASONE VALERATE 0.1 % EX CREA: TOPICAL_CREAM | Freq: Two times a day (BID) | CUTANEOUS | 1 refills | Status: DC

## 2022-05-16 MED ORDER — CABOTEGRAVIR & RILPIVIRINE ER 600 & 900 MG/3ML IM SUER
1.0000 | Freq: Once | INTRAMUSCULAR | Status: AC
  Administered 2022-05-16: 1 via INTRAMUSCULAR

## 2022-05-16 NOTE — Patient Instructions (Signed)
Ozempic or Reginal Lutes are the once weekly injections you can do at home to help with blood sugar control. These have become very popular now and I want you to talk to Dr. Pecola Leisure about this as an option with diet and exercise.    Will have you back for your next 2 injections with our pharmacy team for September and November.   Will move out lab checks to every 4 months for you since all has been well for a while.   Will see you back with me again in 6 months in January for your injection then.

## 2022-05-16 NOTE — Addendum Note (Signed)
Addended by: Philippa Chester on: 05/16/2022 11:04 AM   Modules accepted: Orders

## 2022-05-16 NOTE — Assessment & Plan Note (Signed)
Well managed on q9m cabenuva injections. Has received all shots very timely and has had undetectable VL's for 8 months now. Will move to Q60m viral load monitoring with labs due in September.   Will discuss statin after 40 as part of REPRIEVE trial results for heart disease prevention.  Flu shot due in fall (October). Not due for pneumococcal for a while.

## 2022-05-16 NOTE — Assessment & Plan Note (Addendum)
Spent time talking about other options - Ozempic may be a good one for him.

## 2022-05-16 NOTE — Progress Notes (Signed)
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 male with HIV dx 04/11/2007. Started medications in 2009.  In care with [facility] 2016 - 2021.  CD4 nadir > 200 VL < 20  HIV Risk: sexual, msm History of OIs: none Intake Labs 03/2021: Hep B sAg (-), sAb (+), cAb (-); Hep A (+), Hep C (+, RNA - 03/2021) Quantiferon (-) HLA B*5701 (-) G6PD: ()   Previous Regimens: Atripla 2009 - 2016 Triumeq, 2017 - 2022  Cabenuva 05/2021    Genotypes: None on file  Subjective:   Chief Complaint  Patient presents with   Follow-up     HPI: [name] is here for follow up for HIV.   Continues on cabenuva injections q84m- last VL in May 23 not detected - has been this way for 8 months now. Has had hmpx vaccines. Has noticed that after each injection he has an intensely itchy reaction around the injection sites. No welting effect from what he can tell. Has always had itchy skin.   Working with therapist (individual and couples) - spent time discussing work and progress here.   Meeting with PCP soon - leery to start metformin for pre-diabetes d/t side effects. A1C 6.3%. Strong family history here.    Following with [facility] now for precordial chest pain and DOE - recommended exercise stress test to eval for exercise induced ischemia and functional capacity. Echo also ordered to assess for structural component.         05/16/2022   10:28 AM  Depression screen PHQ 2/9  Decreased Interest 0  Down, Depressed, Hopeless 0  PHQ - 2 Score 0     Review of Systems  Constitutional:  Negative for chills, fever, malaise/fatigue and weight loss.  HENT:  Negative for sore throat.   Respiratory:  Negative for cough, sputum production and shortness of breath.   Cardiovascular: Negative.   Gastrointestinal:  Negative for abdominal pain, diarrhea and vomiting.  Musculoskeletal:  Negative for joint pain, myalgias and neck pain.  Skin:  Negative for rash.  Neurological:  Negative for headaches.  Psychiatric/Behavioral:  Negative for depression and substance abuse. The patient is not nervous/anxious.   All other systems reviewed and are negative.    Past Medical History:  Diagnosis Date   Colitis    HIV (human immunodeficiency virus infection) ([place]    HIV (human immunodeficiency virus infection) ([place]    Hyperlipidemia    Preproliferative diabetic retinopathy ([place]     Outpatient Medications Prior to Visit  Medication Sig Dispense Refill   atorvastatin (LIPITOR) 10 MG tablet Take 10 mg by mouth daily.     cabotegravir & rilpivirine ER (CABENUVA) 600 & 900 MG/3ML injection Inject 1 kit into the muscle every 2 (two) months. 6 mL 5   hydrOXYzine (ATARAX/VISTARIL) 25 MG tablet Take 25 mg by mouth 2 (two) times daily as needed.     traZODone (DESYREL)  100 MG tablet TAKE ONE TABLET BY MOUTH AT BEDTIME MAY REPEAT SAME DOSE ONE TIME IF STILL AWAKE AN HOUR AFTER INITIAL DOSE.     valACYclovir (VALTREX) 500 MG tablet Take 500 mg by mouth daily.     venlafaxine XR (EFFEXOR-XR) 150 MG 24 hr capsule TAKE 1 CAPSULE BY MOUTH EVERY DAY 90 capsule 1   naltrexone (DEPADE) 50 MG tablet Take 1 tablet (50 mg total) by mouth daily. (Patient not taking: Reported on 11/11/2021) 30 tablet 2   No facility-administered medications prior to visit.     No Known Allergies  Social History   Tobacco Use   Smoking status:  Every Day    Types: E-cigarettes   Smokeless tobacco: Never  Vaping Use   Vaping Use: Every day   Substances: Nicotine  Substance Use Topics   Alcohol use: Not Currently   Drug use: Not Currently     Social History   Substance and Sexual Activity  Sexual Activity Yes   Partners: Male   Comment: declined condoms     Objective:   Vitals:   05/16/22 1031  BP: 132/86  Pulse: 70  Temp: (!) 97.3 F (36.3 C)  TempSrc: Temporal  Weight: 180 lb (81.6 kg)   Body mass index is 27.37 kg/m.  Physical Exam Vitals reviewed.  Constitutional:      Appearance: He is well-developed.     Comments: Seated comfortably in chair during visit.   HENT:     Mouth/Throat:     Dentition: Normal dentition. No dental abscesses.  Cardiovascular:     Rate and Rhythm: Normal rate and regular rhythm.     Heart sounds: Normal heart sounds.  Pulmonary:     Effort: Pulmonary effort is normal.     Breath sounds: Normal breath sounds.  Abdominal:     General: There is no distension.     Palpations: Abdomen is soft.     Tenderness: There is no abdominal tenderness.  Lymphadenopathy:     Cervical: No cervical adenopathy.  Skin:    General: Skin is warm and dry.     Findings: No rash.  Neurological:     Mental Status: He is alert and oriented to person, place, and time.  Psychiatric:        Judgment: Judgment normal.     Comments: In good spirits today and engaged in care discussion.      Lab Results Lab Results  Component Value Date   WBC 6.9 04/14/2021   HGB 14.1 04/14/2021   HCT 45.6 04/14/2021   MCV 75.2 (L) 04/14/2021   PLT 235 04/14/2021    Lab Results  Component Value Date   CREATININE 1.00 04/14/2021   BUN 12 04/14/2021   NA 142 04/14/2021   K 4.2 04/14/2021   CL 107 04/14/2021   CO2 25 04/14/2021    Lab Results  Component Value Date   ALT 52 (H) 04/14/2021   AST 32 04/14/2021   BILITOT 0.3 04/14/2021    Lab Results  Component Value Date   CHOL 129 04/14/2021    HDL 27 (L) 04/14/2021   LDLCALC 74 04/14/2021   TRIG 180 (H) 04/14/2021   CHOLHDL 4.8 04/14/2021   HIV 1 RNA Quant  Date Value  03/11/2022 NOT DETECTED copies/mL  01/07/2022 Not Detected Copies/mL  11/11/2021 Not Detected Copies/mL   CD4 T Cell Abs (/uL)  Date Value  07/12/2021 913  04/14/2021 1,044     Assessment & Plan:   Problem  List Items Addressed This Visit       Unprioritized   HIV infection ([place])    Well managed on q63mcabenuva injections. Has received all shots very timely and has had undetectable VL's for 8 months now. Will move to Q438miral load monitoring with labs due in September.   Will discuss statin after 40 as part of REPRIEVE trial results for heart disease prevention.  Flu shot due in fall (October). Not due for pneumococcal for a while.       Prediabetes    Spent time talking about other options - Ozempic may be a good one for him.       Pruritus    Will try higher dose steroid cram BID to the site and start OTC antihistamines 2 days prior and 3 days after injections to see if that helps.        StJanene MadeiraMSN, NP-C Re[facility] Infectious DiLake Prestonager: [phone]: [phone] 05/16/22  10:55 AM

## 2022-05-16 NOTE — Assessment & Plan Note (Signed)
Will try higher dose steroid cram BID to the site and start OTC antihistamines 2 days prior and 3 days after injections to see if that helps.

## 2022-06-10 ENCOUNTER — Ambulatory Visit: Payer: BC Managed Care – PPO

## 2022-06-10 ENCOUNTER — Inpatient Hospital Stay: Admission: RE | Admit: 2022-06-10 | Payer: BC Managed Care – PPO | Source: Ambulatory Visit

## 2022-06-10 ENCOUNTER — Encounter (HOSPITAL_COMMUNITY): Payer: Self-pay

## 2022-06-10 ENCOUNTER — Ambulatory Visit (HOSPITAL_COMMUNITY)
Admission: EM | Admit: 2022-06-10 | Discharge: 2022-06-10 | Disposition: A | Discharge status: Home / Self Care | Payer: BC Managed Care – PPO | Attending: Family Medicine | Admitting: Family Medicine

## 2022-06-10 DIAGNOSIS — R0609 Other forms of dyspnea: Secondary | ICD-10-CM

## 2022-06-10 DIAGNOSIS — R072 Precordial pain: Secondary | ICD-10-CM

## 2022-06-10 DIAGNOSIS — Z202 Contact with and (suspected) exposure to infections with a predominantly sexual mode of transmission: Secondary | ICD-10-CM | POA: Diagnosis present

## 2022-06-10 MED ORDER — DOXYCYCLINE HYCLATE 100 MG PO CAPS: 100.0000 mg | ORAL_CAPSULE | Freq: Two times a day (BID) | ORAL | 0 refills | Status: AC

## 2022-06-10 MED ORDER — CEFTRIAXONE SODIUM 500 MG IJ SOLR
500.0000 mg | Freq: Once | INTRAMUSCULAR | Status: AC
Start: 2022-06-10 — End: 2022-06-10
  Administered 2022-06-10: 500 mg via INTRAMUSCULAR

## 2022-06-10 MED ORDER — CEFTRIAXONE SODIUM 500 MG IJ SOLR
INTRAMUSCULAR | Status: AC
  Filled 2022-06-10: qty 500

## 2022-06-10 NOTE — Discharge Instructions (Addendum)
You have been given a shot of ceftriaxone 500 mg  Take doxycycline 100 mg --1 capsule 2 times daily for 7 days  Staff will notify you of any positives on the swabs or on the blood test.

## 2022-06-10 NOTE — ED Triage Notes (Signed)
Pt states he was told a week ago his partner was positive for gonorrhea and chlamydia. States having burning and itching to penis and rectum since Wednesday afternoon.

## 2022-06-10 NOTE — ED Provider Notes (Signed)
[place]    CSN: [id number] Arrival date & time: 06/10/22  1351      History   Chief Complaint Chief Complaint  Patient presents with   Exposure to STD    HPI Justin [name] is a 41 y.o. male.    Exposure to STD   Here with a 2 or 3-day history of rectal itching and penile itching.  No discharge so far.  No abdominal pain or vomiting.  He was informed by a sexual partner 2 or 3 days ago that partner had tested positive for gonorrhea and chlamydia.  The patient has HIV and is treated.  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 Authorizing Provider  doxycycline (VIBRAMYCIN) 100 MG capsule Take 1 capsule (100 mg total) by mouth 2 (two) times daily for 7 days. 06/10/22 06/17/22 Yes Barrett Henle, MD  atorvastatin (LIPITOR) 10 MG tablet Take 10 mg by mouth daily. 04/12/21   [provider]  betamethasone valerate (VALISONE) 0.1 % cream Apply topically 2 (two) times daily. 05/16/22   [place] Callas, NP  cabotegravir &  rilpivirine ER (CABENUVA) 600 & 900 MG/3ML injection Inject 1 kit into the muscle every 2 (two) months. 06/11/21   Kuppelweiser, Cassie L, RPH-CPP  hydrOXYzine (ATARAX/VISTARIL) 25 MG tablet Take 25 mg by mouth 2 (two) times daily as needed. 04/09/21   [provider]  naltrexone (DEPADE) 50 MG tablet Take 1 tablet (50 mg total) by mouth daily. Patient not taking: Reported on 11/11/2021 04/07/21   Mozingo, Berdie Ogren, NP  traZODone (DESYREL) 100 MG tablet TAKE ONE TABLET BY MOUTH AT BEDTIME MAY REPEAT SAME DOSE ONE TIME IF STILL AWAKE AN HOUR AFTER INITIAL DOSE. 10/06/21   [provider]  valACYclovir (VALTREX) 500 MG tablet Take 500 mg by mouth daily. 04/13/21   [provider]  venlafaxine XR (EFFEXOR-XR) 150 MG 24 hr capsule TAKE 1 CAPSULE BY MOUTH EVERY DAY 05/14/21   Mozingo, Berdie Ogren, NP    Family History Family History  Problem Relation Age of Onset   Hypertension Mother    Diabetes Mother    Heart failure Father     Social History Social History   Tobacco Use   Smoking status: Every Day    Types: E-cigarettes   Smokeless tobacco: Never  Vaping Use   Vaping Use: Every day   Substances: Nicotine  Substance Use Topics   Alcohol use: Not Currently   Drug use: Not Currently     Allergies  Patient has no known allergies.   Review of Systems Review of Systems   Physical Exam Triage Vital Signs ED Triage Vitals [06/10/22 1405]  Enc Vitals Group     BP 129/72     Pulse Rate 75     Resp 18     Temp 98.3 F (36.8 C)     Temp src      SpO2 98 %     Weight      Height      Head Circumference      Peak Flow      Pain Score 0     Pain Loc      Pain Edu?      Excl. in [place]?    No data found.  Updated Vital Signs BP 129/72 (BP Location: Right Arm)   Pulse 75   Temp 98.3 F (36.8 C)   Resp 18   SpO2 98%   Visual Acuity Right Eye Distance:   Left Eye Distance:   Bilateral Distance:    Right Eye Near:   Left Eye Near:     Bilateral Near:     Physical Exam Vitals reviewed.  Constitutional:      General: He is not in acute distress.    Appearance: He is not ill-appearing, toxic-appearing or diaphoretic.  HENT:     Mouth/Throat:     Mouth: Mucous membranes are moist.     Pharynx: No oropharyngeal exudate or posterior oropharyngeal erythema.  Eyes:     Extraocular Movements: Extraocular movements intact.     Pupils: Pupils are equal, round, and reactive to light.  Cardiovascular:     Rate and Rhythm: Normal rate and regular rhythm.     Heart sounds: No murmur heard. Pulmonary:     Effort: Pulmonary effort is normal.     Breath sounds: Normal breath sounds.  Abdominal:     Palpations: Abdomen is soft.     Tenderness: There is no abdominal tenderness.  Skin:    Coloration: Skin is not jaundiced or pale.  Neurological:     Mental Status: He is alert and oriented to person, place, and time.  Psychiatric:        Behavior: Behavior normal.      UC Treatments / Results  Labs (all labs ordered are listed, but only abnormal results are displayed) Labs Reviewed  RPR  CYTOLOGY, (ORAL, ANAL, URETHRAL) ANCILLARY ONLY  CYTOLOGY, (ORAL, ANAL, URETHRAL) ANCILLARY ONLY    EKG   Radiology PCV ECHOCARDIOGRAM COMPLETE  Result Date: 06/10/2022 Echocardiogram 06/10/2022: Normal LV systolic function with visual EF 60-65%. Left ventricle cavity is normal in size. Normal left ventricular wall thickness. Normal global wall motion. Normal diastolic filling pattern, normal LAP. Calculated EF 65%. Structurally normal tricuspid valve with trace regurgitation. No evidence of pulmonary hypertension. Structurally normal pulmonic valve.  Mild pulmonic regurgitation.    Procedures Procedures (including critical care time)  Medications Ordered in UC Medications  cefTRIAXone (ROCEPHIN) injection 500 mg (has no administration in time range)    Initial Impression / Assessment and Plan / UC Course  I have reviewed  the triage vital signs and the nursing notes.  Pertinent labs & imaging results that were available during my care of the patient were reviewed by me and considered in my medical decision making (see chart for details).     Self swab done of the urethra, and I did a rectal swab today.  He is treated empirically for gonorrhea and chlamydia, and  we will notify him of any positives on the swab or blood test.  If his RPR titer is higher than in the past, we will defer to his infectious disease specialist on treatment Final Clinical Impressions(s) / UC Diagnoses   Final diagnoses:  STD exposure     Discharge Instructions      You have been given a shot of ceftriaxone 500 mg  Take doxycycline 100 mg --1 capsule 2 times daily for 7 days  Staff will notify you of any positives on the swabs or on the blood test.     ED Prescriptions     Medication Sig Dispense Auth. Provider   doxycycline (VIBRAMYCIN) 100 MG capsule Take 1 capsule (100 mg total) by mouth 2 (two) times daily for 7 days. 14 capsule Windy Carina, Gwenlyn Perking, MD      PDMP not reviewed this encounter.   Barrett Henle, MD 06/10/22 [phone]

## 2022-06-11 LAB — RPR
RPR Ser Ql: REACTIVE — AB
RPR Titer: 1:4 {titer}

## 2022-06-13 LAB — CYTOLOGY, (ORAL, ANAL, URETHRAL) ANCILLARY ONLY
Chlamydia: NEGATIVE
Chlamydia: NEGATIVE
Comment: NEGATIVE
Comment: NEGATIVE
Comment: NEGATIVE
Comment: NEGATIVE
Comment: NORMAL
Comment: NORMAL
Neisseria Gonorrhea: NEGATIVE
Neisseria Gonorrhea: NEGATIVE
Trichomonas: NEGATIVE
Trichomonas: NEGATIVE

## 2022-06-13 LAB — T.PALLIDUM AB, TOTAL: T Pallidum Abs: REACTIVE — AB

## 2022-06-21 ENCOUNTER — Ambulatory Visit: Payer: BC Managed Care – PPO | Admitting: Cardiology

## 2022-09-14 ENCOUNTER — Ambulatory Visit: Payer: BC Managed Care – PPO

## 2023-03-01 ENCOUNTER — Other Ambulatory Visit: Payer: Self-pay

## 2023-03-01 ENCOUNTER — Ambulatory Visit (INDEPENDENT_AMBULATORY_CARE_PROVIDER_SITE_OTHER): Payer: BC Managed Care – PPO | Admitting: Infectious Diseases

## 2023-03-01 ENCOUNTER — Other Ambulatory Visit (HOSPITAL_COMMUNITY)
Admission: RE | Admit: 2023-03-01 | Discharge: 2023-03-01 | Disposition: A | Discharge status: Home / Self Care | Payer: BC Managed Care – PPO | Source: Ambulatory Visit | Attending: Infectious Diseases | Admitting: Infectious Diseases

## 2023-03-01 ENCOUNTER — Encounter: Payer: Self-pay | Admitting: Infectious Diseases

## 2023-03-01 VITALS — BP 129/97 | HR 77 | Temp 98.1°F | Wt 176.2 lb

## 2023-03-01 DIAGNOSIS — R198 Other specified symptoms and signs involving the digestive system and abdomen: Secondary | ICD-10-CM | POA: Diagnosis not present

## 2023-03-01 DIAGNOSIS — B20 Human immunodeficiency virus [HIV] disease: Secondary | ICD-10-CM | POA: Diagnosis not present

## 2023-03-01 MED ORDER — CEFTRIAXONE SODIUM 500 MG IJ SOLR
500.0000 mg | Freq: Once | INTRAMUSCULAR | Status: AC
  Administered 2023-03-01: 500 mg via INTRAMUSCULAR

## 2023-03-01 NOTE — Progress Notes (Signed)
Name: Justin [name]  DOB: August 07, 1981 MRN: [id number] PCP: Leilani Able, MD    Brief Narrative:  Justin [name] is a 42 y.o. male with HIV dx 04/11/2007. Started medications in 2009.  In care with [facility] 2016 - 2021.  CD4 nadir > 200 VL < 20  HIV Risk: sexual, msm History of OIs: none Intake Labs 03/2021: Hep B sAg (-), sAb (+), cAb (-); Hep A (+), Hep C (+, RNA - 03/2021) Quantiferon (-) HLA B*5701 (-) G6PD: ()   Previous Regimens: Atripla 2009 - 2016 Triumeq, 2017 - 2022  Cabenuva 05/2021    Genotypes: None on file  Subjective:   Chief Complaint  Patient presents with   Follow-up    Upset stomach diarrhea since Saturday/ STD testing      HPI: Justin [name] is here for follow up for HIV. Here for follow up HIV care - has not had cabenuva injection since July 2023. He did not recall it's been this long since  "Muddy water" like bowel movements. Also some urinary symptoms / discomfort present that started this weekend. Lower abdominal / rectal pain. He describes several new sexual partners with versatile contact, with receptive intercourse partner did use a condom but not for other encounters.       05/16/2022   10:28 AM  Depression screen PHQ 2/9  Decreased Interest 0  Down, Depressed, Hopeless 0  PHQ - 2 Score 0     Review of Systems  All other systems reviewed and are negative.    Past Medical History:  Diagnosis Date   Colitis    HIV (human immunodeficiency virus infection) (HCC)    HIV (human immunodeficiency virus infection) (HCC)    Hyperlipidemia    Preproliferative diabetic retinopathy (HCC)     Outpatient Medications Prior to Visit  Medication Sig Dispense Refill   atorvastatin (LIPITOR) 10 MG tablet Take 10 mg by mouth daily.     betamethasone valerate (VALISONE) 0.1 % cream Apply topically 2 (two) times daily. 30 g 1   hydrOXYzine (ATARAX/VISTARIL) 25 MG tablet Take 25 mg by mouth 2 (two) times daily as needed.      prazosin (MINIPRESS) 1 MG capsule Take 1 mg by mouth at bedtime.     traZODone (DESYREL) 100 MG tablet TAKE ONE TABLET BY MOUTH AT BEDTIME MAY REPEAT SAME DOSE ONE TIME IF STILL AWAKE AN HOUR AFTER INITIAL DOSE.     valACYclovir (VALTREX) 500 MG tablet Take 500 mg by mouth daily.     venlafaxine XR (EFFEXOR-XR) 150 MG 24 hr capsule TAKE 1 CAPSULE BY MOUTH EVERY DAY 90 capsule 1   naltrexone (DEPADE) 50 MG tablet Take 1 tablet (50 mg total) by mouth daily. (Patient not taking: Reported on 11/11/2021) 30 tablet 2   cabotegravir & rilpivirine ER (CABENUVA) 600 & 900 MG/3ML injection Inject 1 kit into the muscle every 2 (two) months. (Patient not taking: Reported on 03/01/2023) 6 mL 5   No facility-administered medications prior to visit.     No Known Allergies  Social History   Tobacco Use   Smoking status: Every Day    Types: E-cigarettes   Smokeless tobacco: Never  Vaping Use   Vaping Use: Every day   Substances: Nicotine  Substance Use Topics   Alcohol use: Not Currently   Drug use: Not Currently     Social History   Substance and Sexual Activity  Sexual Activity Yes   Partners: Male   Birth control/protection: None  Comment: declined condoms     Objective:   Vitals:   03/01/23 1337  BP: (!) 129/97  Pulse: 77  Temp: 98.1 F (36.7 C)  TempSrc: Oral  SpO2: 100%  Weight: 176 lb 3.2 oz (79.9 kg)   Body mass index is 26.79 kg/m.  Physical Exam Vitals reviewed.  Constitutional:      Appearance: He is well-developed.     Comments: Seated comfortably in chair during visit.   HENT:     Mouth/Throat:     Dentition: Normal dentition. No dental abscesses.  Cardiovascular:     Rate and Rhythm: Normal rate and regular rhythm.     Heart sounds: Normal heart sounds.  Pulmonary:     Effort: Pulmonary effort is normal.     Breath sounds: Normal breath sounds.  Abdominal:     General: There is no distension.     Palpations: Abdomen is soft.     Tenderness: There is  no abdominal tenderness.  Lymphadenopathy:     Cervical: No cervical adenopathy.  Skin:    General: Skin is warm and dry.     Findings: No rash.  Neurological:     Mental Status: He is alert and oriented to person, place, and time.  Psychiatric:        Judgment: Judgment normal.     Comments: In good spirits today and engaged in care discussion.      Lab Results Lab Results  Component Value Date   WBC 6.9 04/14/2021   HGB 14.1 04/14/2021   HCT 45.6 04/14/2021   MCV 75.2 (L) 04/14/2021   PLT 235 04/14/2021    Lab Results  Component Value Date   CREATININE 1.00 04/14/2021   BUN 12 04/14/2021   NA 142 04/14/2021   K 4.2 04/14/2021   CL 107 04/14/2021   CO2 25 04/14/2021    Lab Results  Component Value Date   ALT 52 (H) 04/14/2021   AST 32 04/14/2021   BILITOT 0.3 04/14/2021    Lab Results  Component Value Date   CHOL 129 04/14/2021   HDL 27 (L) 04/14/2021   LDLCALC 74 04/14/2021   TRIG 180 (H) 04/14/2021   CHOLHDL 4.8 04/14/2021   HIV 1 RNA Quant  Date Value  03/11/2022 NOT DETECTED copies/mL  01/07/2022 Not Detected Copies/mL  11/11/2021 Not Detected Copies/mL   CD4 T Cell Abs (/uL)  Date Value  07/12/2021 913  04/14/2021 1,044     Assessment & Plan:   Problem List Items Addressed This Visit       Unprioritized   HIV infection (HCC) - Primary    Unfortunately missed several doses of cabenuva - will investigate what happened on our end and how he fell through the cracks for follow up.  Will start symtuza today once daily with food. I gave him 27m of samples today while we wait for testing.  Genosure archive and full genotype drawn today given we are not sure what his viral load may be.  Will have him return in 27m to discuss next steps. He understands my concern that cabenuva may not be an option for him based on potential emergence of resistance.  Anal cancer screening discussion next OV after rectal symptoms resolve.  Return in about 4 weeks (around  03/29/2023).       Relevant Orders   GenoSure Archive(SM)   HIV-1 Genotyping (RTI,PI,IN Inhbtr)   Rectal discharge    Symptoms started over the weekend - increased risk for STI  exposure. With urinary and rectal symptoms will treat for gonorrhea and collect site testing to assess for chlamydia.  Seems less likely this is syphilis given urinary symptoms and no skin changes but will screen and follow up his titer.       Relevant Orders   Urine cytology ancillary only   Cytology (oral, anal, urethral) ancillary only   Cytology (oral, anal, urethral) ancillary only   RPR   Rexene Alberts, MSN, NP-C [facility] for Infectious Disease [facility] Pager: [phone] Office: [phone]   03/01/23  3:06 PM

## 2023-03-01 NOTE — Assessment & Plan Note (Signed)
Unfortunately missed several doses of cabenuva - will investigate what happened on our end and how he fell through the cracks for follow up.  Will start symtuza today once daily with food. I gave him 34m of samples today while we wait for testing.  Genosure archive and full genotype drawn today given we are not sure what his viral load may be.  Will have him return in 40m to discuss next steps. He understands my concern that cabenuva may not be an option for him based on potential emergence of resistance.  Anal cancer screening discussion next OV after rectal symptoms resolve.  Return in about 4 weeks (around 03/29/2023).

## 2023-03-01 NOTE — Patient Instructions (Signed)
Start Symtuza once a day from the samples we gave you.  Common side effects can be diarrhea / abdominal pain  Make sure you take it with a full meal  Will have you back in 1 month to go over test results to see what we need to do next for your treatment.

## 2023-03-01 NOTE — Assessment & Plan Note (Signed)
Symptoms started over the weekend - increased risk for STI exposure. With urinary and rectal symptoms will treat for gonorrhea and collect site testing to assess for chlamydia.  Seems less likely this is syphilis given urinary symptoms and no skin changes but will screen and follow up his titer.

## 2023-03-02 LAB — URINE CYTOLOGY ANCILLARY ONLY
Chlamydia: NEGATIVE
Comment: NEGATIVE
Comment: NORMAL
Neisseria Gonorrhea: NEGATIVE

## 2023-03-02 LAB — CYTOLOGY, (ORAL, ANAL, URETHRAL) ANCILLARY ONLY
Chlamydia: NEGATIVE
Chlamydia: NEGATIVE
Comment: NEGATIVE
Comment: NEGATIVE
Comment: NORMAL
Comment: NORMAL
Neisseria Gonorrhea: NEGATIVE
Neisseria Gonorrhea: NEGATIVE

## 2023-03-03 DIAGNOSIS — B20 Human immunodeficiency virus [HIV] disease: Secondary | ICD-10-CM

## 2023-03-03 DIAGNOSIS — R198 Other specified symptoms and signs involving the digestive system and abdomen: Secondary | ICD-10-CM

## 2023-03-03 MED ORDER — DOXYCYCLINE HYCLATE 100 MG PO TABS: 100.0000 mg | ORAL_TABLET | Freq: Two times a day (BID) | ORAL | 0 refills | Status: AC

## 2023-03-03 NOTE — Addendum Note (Signed)
Addended by: Blanchard Kelch on: 03/03/2023 09:47 AM   Modules accepted: Orders

## 2023-03-03 NOTE — Addendum Note (Signed)
Addended by: Blanchard Kelch on: 03/03/2023 10:13 AM   Modules accepted: Orders

## 2023-03-07 ENCOUNTER — Other Ambulatory Visit: Payer: Self-pay | Admitting: Infectious Diseases

## 2023-03-07 ENCOUNTER — Other Ambulatory Visit: Payer: BC Managed Care – PPO

## 2023-03-07 ENCOUNTER — Other Ambulatory Visit: Payer: Self-pay

## 2023-03-07 DIAGNOSIS — B20 Human immunodeficiency virus [HIV] disease: Secondary | ICD-10-CM

## 2023-03-07 LAB — CBC
HCT: 43.8 % (ref 38.5–50.0)
Hemoglobin: 13.4 g/dL (ref 13.2–17.1)
MCH: 23.2 pg — ABNORMAL LOW (ref 27.0–33.0)
MCHC: 30.6 g/dL — ABNORMAL LOW (ref 32.0–36.0)
MCV: 75.8 fL — ABNORMAL LOW (ref 80.0–100.0)
MPV: 12.8 fL — ABNORMAL HIGH (ref 7.5–12.5)
Platelets: 251 10*3/uL (ref 140–400)
RBC: 5.78 10*6/uL (ref 4.20–5.80)
RDW: 13.7 % (ref 11.0–15.0)
WBC: 8.6 10*3/uL (ref 3.8–10.8)

## 2023-03-08 LAB — T-HELPER CELLS (CD4) COUNT (NOT AT ARMC)
CD4 % Helper T Cell: 34 % (ref 33–65)
CD4 T Cell Abs: 1440 /uL (ref 400–1790)

## 2023-03-09 ENCOUNTER — Other Ambulatory Visit: Payer: Self-pay | Admitting: Pharmacist

## 2023-03-09 DIAGNOSIS — B20 Human immunodeficiency virus [HIV] disease: Secondary | ICD-10-CM

## 2023-03-09 MED ORDER — SYMTUZA 800-150-200-10 MG PO TABS: 1.0000 | ORAL_TABLET | Freq: Every day | ORAL | 0 refills | Status: AC

## 2023-03-09 NOTE — Progress Notes (Signed)
Medication Samples have been provided to the patient.  Drug name: Symtuza        Strength: 800/150/200/10 mg Qty: 30 Tablets (1 bottles) LOT: [id number]   Exp.Date: 4/25  Dosing instructions: Take one tablet by mouth once daily with food  The patient has been instructed regarding the correct time, dose, and frequency of taking this medication, including desired effects and most common side effects.   Katalina Magri, PharmD, CPP, BCIDP, AAHIVP Clinical Pharmacist Practitioner Infectious Diseases Clinical Pharmacist [facility]  

## 2023-03-16 LAB — HIV-1 GENOTYPING (RTI,PI,IN INHBTR)
Date Viral Load Collected: 5082024
HIV-1 Genotype: NOT DETECTED

## 2023-03-16 LAB — T PALLIDUM AB: T Pallidum Abs: POSITIVE — AB

## 2023-03-16 LAB — RPR: RPR Ser Ql: REACTIVE — AB

## 2023-03-16 LAB — RPR TITER: RPR Titer: 1:2 {titer} — ABNORMAL HIGH

## 2023-03-21 ENCOUNTER — Other Ambulatory Visit: Payer: Self-pay | Admitting: Infectious Diseases

## 2023-03-24 ENCOUNTER — Other Ambulatory Visit: Payer: Self-pay

## 2023-03-24 ENCOUNTER — Encounter: Payer: Self-pay | Admitting: Infectious Diseases

## 2023-03-24 ENCOUNTER — Ambulatory Visit (INDEPENDENT_AMBULATORY_CARE_PROVIDER_SITE_OTHER): Payer: BC Managed Care – PPO | Admitting: Infectious Diseases

## 2023-03-24 VITALS — BP 132/88 | HR 81 | Temp 98.1°F | Resp 16 | Wt 173.0 lb

## 2023-03-24 DIAGNOSIS — B20 Human immunodeficiency virus [HIV] disease: Secondary | ICD-10-CM

## 2023-03-24 DIAGNOSIS — S0990XA Unspecified injury of head, initial encounter: Secondary | ICD-10-CM | POA: Diagnosis not present

## 2023-03-24 NOTE — Assessment & Plan Note (Signed)
While out of town earlier this week Justin [name] passed out and hit his head on a table and woke up on the floor in blood d/t laceration on the anterior forehead. He went for care to be evaluated out of concern for concussion and cleared based on interview and exam w/o imaging.  On exam today the laceration is healing well and with only mild swelling. No s/s of infection.  His symptoms sound to be improving including soreness. I am not sure the tinnitus is related given that has happened to him in the past.  We discussed signs to be on the look out for that would warrant re-evaluation.  Discussed scar care as well.

## 2023-03-24 NOTE — Assessment & Plan Note (Addendum)
Had to redraw the genosure archive for him and should be back next week.  I have a feeling we may be able to continue the cabenuva w/o concern for him - his routine genotype was not able to be detected indicating his viral load is at least < 500 copies.  Will update him next week. He will continue the symtuza he has available now until we can determine further.  Will need to discuss prophylactic statin therapy and anal cancer screening.

## 2023-03-24 NOTE — Progress Notes (Signed)
Name: Justin [name]  DOB: 11-11-1980 MRN: [id number] PCP: Leilani Able, MD    Brief Narrative:  Justin [name] is a 42 y.o. male with HIV dx 04/11/2007. Started medications in 2009.  In care with [facility] 2016 - 2021.  CD4 nadir > 200 VL < 20  HIV Risk: sexual, msm History of OIs: none Intake Labs 03/2021: Hep B sAg (-), sAb (+), cAb (-); Hep A (+), Hep C (+, RNA - 03/2021) Quantiferon (-) HLA B*5701 (-) G6PD: ()   Previous Regimens: Atripla 2009 - 2016 Triumeq, 2017 - 2022  Cabenuva 05/2021    Genotypes: None on file  Subjective:   Chief Complaint  Patient presents with   Follow-up    B20 - patient reports he fainted on 5/29 and has small abrasion on his forehead. Patient seen a MD in [place] - didn't have any sx's of concusion so no CT scan was done. Pt reports having tingling in his fingers and had some ringing in his ears last night.      HPI: Justin [name] is here for 87m follow up after starting symtuza. He has continued to take this everyday with good tolerance. Side effects have leveled off.  Fell on 5/29 onto a table in [place] - has a laceration on the center of the forehead. Had some ringing in the ears yesterday and tingling in the fingers. Arms are sore and grip is a little weak R>L.  Neck pain has subsided. No vision changes. Just a dull headache.   GI symptoms have resolved completely - finished the doxycycline for treatment.      03/24/2023   10:54 AM  Depression screen PHQ 2/9  Decreased Interest 0  Down, Depressed, Hopeless 0  PHQ - 2 Score 0     Review of Systems  All other systems reviewed and are negative.    Past Medical History:  Diagnosis Date   Colitis    HIV (human immunodeficiency virus infection) (HCC)    HIV (human immunodeficiency virus infection) (HCC)    Hyperlipidemia    Preproliferative diabetic retinopathy (HCC)     Outpatient Medications Prior to Visit  Medication Sig Dispense Refill   atorvastatin  (LIPITOR) 10 MG tablet Take 10 mg by mouth daily.     betamethasone valerate (VALISONE) 0.1 % cream Apply topically 2 (two) times daily. 30 g 1   Darunavir-Cobicistat-Emtricitabine-Tenofovir Alafenamide (SYMTUZA) 800-150-200-10 MG TABS Take 1 tablet by mouth daily with breakfast. 30 tablet 0   doxycycline (VIBRA-TABS) 100 MG tablet Take 1 tablet (100 mg total) by mouth 2 (two) times daily with a meal for 21 days. 42 tablet 0   hydrOXYzine (ATARAX/VISTARIL) 25 MG tablet Take 25 mg by mouth 2 (two) times daily as needed.     prazosin (MINIPRESS) 1 MG capsule Take 1 mg by mouth at bedtime.     traZODone (DESYREL) 100 MG tablet TAKE ONE TABLET BY MOUTH AT BEDTIME MAY REPEAT SAME DOSE ONE TIME IF STILL AWAKE AN HOUR AFTER INITIAL DOSE.     valACYclovir (VALTREX) 500 MG tablet Take 500 mg by mouth daily.     venlafaxine XR (EFFEXOR-XR) 150 MG 24 hr capsule TAKE 1 CAPSULE BY MOUTH EVERY DAY 90 capsule 1   naltrexone (DEPADE) 50 MG tablet Take 1 tablet (50 mg total) by mouth daily. (Patient not taking: Reported on 11/11/2021) 30 tablet 2   No facility-administered medications prior to visit.     No Known Allergies  Social History  Tobacco Use   Smoking status: Every Day    Types: E-cigarettes   Smokeless tobacco: Never  Vaping Use   Vaping Use: Every day   Substances: Nicotine  Substance Use Topics   Alcohol use: Not Currently   Drug use: Not Currently     Social History   Substance and Sexual Activity  Sexual Activity Yes   Partners: Male   Birth control/protection: None   Comment: declined condoms     Objective:   Vitals:   03/24/23 1051  BP: 132/88  Pulse: 81  Resp: 16  Temp: 98.1 F (36.7 C)  TempSrc: Temporal  SpO2: 98%  Weight: 173 lb (78.5 kg)   Body mass index is 26.3 kg/m.  Physical Exam Vitals reviewed.  Constitutional:      Appearance: He is well-developed.     Comments: Seated comfortably in chair during visit.   HENT:     Head:      Comments:  Clean dry laceration w/o surrounding erythema. Slight swelling    Mouth/Throat:     Dentition: Normal dentition. No dental abscesses.  Cardiovascular:     Rate and Rhythm: Normal rate and regular rhythm.     Heart sounds: Normal heart sounds.  Pulmonary:     Effort: Pulmonary effort is normal.     Breath sounds: Normal breath sounds.  Abdominal:     General: There is no distension.     Palpations: Abdomen is soft.     Tenderness: There is no abdominal tenderness.  Lymphadenopathy:     Cervical: No cervical adenopathy.  Skin:    General: Skin is warm and dry.     Findings: No rash.  Neurological:     Mental Status: He is alert and oriented to person, place, and time.  Psychiatric:        Judgment: Judgment normal.     Comments: In good spirits today and engaged in care discussion.      Lab Results Lab Results  Component Value Date   WBC 8.6 03/07/2023   HGB 13.4 03/07/2023   HCT 43.8 03/07/2023   MCV 75.8 (L) 03/07/2023   PLT 251 03/07/2023    Lab Results  Component Value Date   CREATININE 1.00 04/14/2021   BUN 12 04/14/2021   NA 142 04/14/2021   K 4.2 04/14/2021   CL 107 04/14/2021   CO2 25 04/14/2021    Lab Results  Component Value Date   ALT 52 (H) 04/14/2021   AST 32 04/14/2021   BILITOT 0.3 04/14/2021    Lab Results  Component Value Date   CHOL 129 04/14/2021   HDL 27 (L) 04/14/2021   LDLCALC 74 04/14/2021   TRIG 180 (H) 04/14/2021   CHOLHDL 4.8 04/14/2021   HIV 1 RNA Quant  Date Value  03/11/2022 NOT DETECTED copies/mL  01/07/2022 Not Detected Copies/mL  11/11/2021 Not Detected Copies/mL   CD4 T Cell Abs (/uL)  Date Value  03/07/2023 1,440  07/12/2021 913  04/14/2021 1,044     Assessment & Plan:   Problem List Items Addressed This Visit       Unprioritized   HIV infection (HCC) - Primary    Had to redraw the genosure archive for him and should be back next week.  I have a feeling we may be able to continue the cabenuva w/o concern  for him - his routine genotype was not able to be detected indicating his viral load is at least < 500 copies.  Will  update him next week. He will continue the symtuza he has available now until we can determine further.  Will need to discuss prophylactic statin therapy and anal cancer screening.       Head injury    While out of town earlier this week Justin [name] passed out and hit his head on a table and woke up on the floor in blood d/t laceration on the anterior forehead. He went for care to be evaluated out of concern for concussion and cleared based on interview and exam w/o imaging.  On exam today the laceration is healing well and with only mild swelling. No s/s of infection.  His symptoms sound to be improving including soreness. I am not sure the tinnitus is related given that has happened to him in the past.  We discussed signs to be on the look out for that would warrant re-evaluation.  Discussed scar care as well.        Rexene Alberts, MSN, NP-C [facility] for Infectious Disease [facility] Pager: [phone] Office: [phone]   03/24/23  1:21 PM

## 2023-03-25 LAB — GENOSURE ARCHIVE(SM)

## 2023-03-25 LAB — SPECIMEN STATUS REPORT

## 2023-04-03 ENCOUNTER — Other Ambulatory Visit (HOSPITAL_COMMUNITY): Payer: Self-pay

## 2023-04-03 NOTE — Progress Notes (Signed)
Sounds good to me. 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 Thankful! We will do our part to ensure he does not fall through the cracks, and he will also need to do his part in keeping up with appointments. Thanks [employer]. Will let Lupita Leash perform another benefits investigation to see what has changed. Will keep a close eye on his viral load as well. Marchelle Folks

## 2023-04-04 ENCOUNTER — Telehealth: Payer: Self-pay

## 2023-04-04 ENCOUNTER — Other Ambulatory Visit (HOSPITAL_COMMUNITY): Payer: Self-pay

## 2023-04-04 NOTE — Telephone Encounter (Signed)
[facility] Patient Advocate Encounter   Received notification from [facility] that prior authorization for Justin [name] is required.(Pharmacy Benefits)   PA submitted on 04/04/23 Key [id number] Status is pending    [facility] Clinic will continue to follow.   Clearance Coots, CPhT Specialty Pharmacy Patient [facility] for Infectious Disease Phone: [phone] Fax:  [phone]

## 2023-04-06 ENCOUNTER — Other Ambulatory Visit (HOSPITAL_COMMUNITY): Payer: Self-pay

## 2023-04-06 ENCOUNTER — Telehealth: Payer: Self-pay | Admitting: Pharmacist

## 2023-04-06 NOTE — Telephone Encounter (Signed)
[facility] has denied PA for Cabenuva. Faxed appeal to PA department today and will await response.  Margarite Gouge, PharmD, CPP, BCIDP, AAHIVP Clinical Pharmacist Practitioner Infectious Diseases Clinical Pharmacist [facility] for Infectious Disease

## 2023-04-11 ENCOUNTER — Telehealth: Payer: Self-pay

## 2023-04-11 NOTE — Telephone Encounter (Signed)
[facility] Patient Advocate Encounter  Received notification from [facility] that the request for prior authorization for Justin [name] has been denied due to Patient will need to have tried and failed Triumeq and Symtuza and would like reason for discontiuaton on Atripla.      Clearance Coots, CPhT Specialty Pharmacy Patient [facility] for Infectious Disease Phone: [phone] Fax:  [phone]

## 2023-04-11 NOTE — Telephone Encounter (Signed)
Pharmacy Patient Advocate Encounter- Renaldo Harrison BIV-Medical Benefit:  J code: [id number]  CPT code: 60454  Dx Code: B20  NO PA required through [employer] [place].

## 2023-04-12 ENCOUNTER — Encounter: Payer: Self-pay | Admitting: Pharmacist

## 2023-04-12 NOTE — Telephone Encounter (Signed)
Thanks Lupita Leash! I'll reach out to him.

## 2023-04-20 ENCOUNTER — Other Ambulatory Visit: Payer: Self-pay

## 2023-04-20 ENCOUNTER — Ambulatory Visit (INDEPENDENT_AMBULATORY_CARE_PROVIDER_SITE_OTHER): Payer: BC Managed Care – PPO | Admitting: Pharmacist

## 2023-04-20 DIAGNOSIS — B20 Human immunodeficiency virus [HIV] disease: Secondary | ICD-10-CM | POA: Diagnosis not present

## 2023-04-20 MED ORDER — CABOTEGRAVIR & RILPIVIRINE ER 600 & 900 MG/3ML IM SUER
1.0000 | Freq: Once | INTRAMUSCULAR | Status: AC
  Administered 2023-04-20: 1 via INTRAMUSCULAR

## 2023-04-20 NOTE — Progress Notes (Signed)
HPI: Justin [name] is a 42 y.o. male who presents to the [facility] pharmacy clinic for [place] administration.  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   BETAMETHASONE VALERATE (VALISONE) 0.1 % CREAM    Apply topically 2 (two) times daily.   HYDROXYZINE (ATARAX/VISTARIL) 25 MG TABLET    Take 25 mg by mouth 2 (two) times daily as needed.   NALTREXONE (DEPADE) 50 MG TABLET    Take 1 tablet (50 mg total) by mouth daily.   PRAZOSIN (MINIPRESS) 1 MG CAPSULE    Take 1 mg by mouth at bedtime.   TRAZODONE (DESYREL) 100 MG TABLET    TAKE ONE TABLET BY MOUTH AT BEDTIME MAY REPEAT SAME DOSE ONE TIME IF STILL AWAKE AN HOUR AFTER INITIAL DOSE.   VALACYCLOVIR (VALTREX) 500 MG TABLET    Take 500 mg by mouth daily.   VENLAFAXINE XR (EFFEXOR-XR) 150 MG 24 HR CAPSULE    TAKE 1 CAPSULE BY MOUTH EVERY DAY  Modified Medications   No medications on file  Discontinued Medications   No medications on file    Allergies: No Known Allergies  Past Medical History: Past Medical History:  Diagnosis Date   Colitis    HIV (human immunodeficiency virus infection) (HCC)    HIV (human immunodeficiency virus infection) (HCC)    Hyperlipidemia    Preproliferative diabetic retinopathy (HCC)     Social History: Social History   Socioeconomic History   Marital status: Married    Spouse name:  Not on file   Number of children: 0   Years of education: Not on file   Highest education level: Not on file  Occupational History   Not on file  Tobacco Use   Smoking status: Every Day    Types: E-cigarettes   Smokeless tobacco: Never  Vaping Use   Vaping Use: Every day   Substances: Nicotine  Substance and Sexual Activity   Alcohol use: Not Currently   Drug use: Not Currently   Sexual activity: Yes    Partners: Male    Birth control/protection: None    Comment: declined condoms  Other Topics Concern   Not on file  Social History Narrative   Not on file   Social Determinants of Health   Financial Resource Strain: Not on file  Food Insecurity: Not on file  Transportation Needs: Not on file  Physical Activity: Not on file  Stress: Not on file  Social Connections: Not on file    Labs: Lab Results  Component Value Date   HIV1RNAQUANT NOT DETECTED 03/11/2022   HIV1RNAQUANT Not Detected 01/07/2022   HIV1RNAQUANT Not Detected 11/11/2021   CD4TABS 1,440 03/07/2023   CD4TABS 913 07/12/2021   CD4TABS 1,044 04/14/2021    RPR and STI Lab Results  Component Value Date   LABRPR REACTIVE (A) 03/01/2023   LABRPR Reactive (A) 06/10/2022  LABRPR REACTIVE (A) 01/07/2022   LABRPR REACTIVE (A) 07/12/2021   LABRPR Reactive (A) 01/26/2021   RPRTITER 1:2 (H) 03/01/2023   RPRTITER 1:2 (H) 01/07/2022   RPRTITER 1:4 (H) 07/12/2021    STI Results GC CT  03/01/2023  1:53 PM Negative  Negative   03/01/2023  1:51 PM Negative  Negative   03/01/2023  1:42 PM Negative  Negative   06/10/2022  3:14 PM Negative    Negative  Negative    Negative   09/15/2021 11:20 AM Negative  Negative   07/12/2021  3:41 PM Negative    Negative    Negative  Negative    Negative    Negative   04/14/2021  3:52 PM Negative  Negative   01/26/2021  2:32 PM Negative  Negative   04/11/2020 11:04 AM Negative  Negative     Hepatitis B Lab Results  Component Value Date   HEPBSAB REACTIVE (A) 04/14/2021    HEPBSAG NON-REACTIVE 04/14/2021   HEPBCAB NON-REACTIVE 04/14/2021   Hepatitis C Lab Results  Component Value Date   HEPCAB REACTIVE (A) 04/14/2021   HCVRNAPCRQN <15 NOT DETECTED 04/14/2021   Hepatitis A Lab Results  Component Value Date   HAV REACTIVE (A) 04/14/2021   Lipids: Lab Results  Component Value Date   CHOL 129 04/14/2021   TRIG 180 (H) 04/14/2021   HDL 27 (L) 04/14/2021   CHOLHDL 4.8 04/14/2021   LDLCALC 74 04/14/2021    Current HIV Regimen: Symtuza  TARGET DATE: The 27th of the month  Assessment: Maria presents today for their first initiation injection for Cabenuva. Counseled that [place] is two separate intramuscular injections in the gluteal muscle on each side for each visit. Explained that the second injection is 30 days after the initial injection then every 2 months thereafter. Discussed the need for viral load monitoring every 2 months for the first 6 months and then periodically afterwards as their provider sees the need. Discussed the rare but significant chance of developing resistance despite compliance. Explained that showing up to injection appointments is very important and warned that if 2 appointments are missed, it will be reassessed by their provider whether they are a good candidate for injection therapy. Counseled on possible side effects associated with the injections such as injection site pain, which is usually mild to moderate in nature, injection site nodules, and injection site reactions. Asked to call the clinic or send me a mychart message if they experience any issues, such as fatigue, nausea, headache, rash, or dizziness. Advised that they can take ibuprofen or tylenol for injection site pain if needed.   Basil is very thankful to be restarting Cabenuva as taking daily oral medication reminds him that he "didn't love himself very well in the past". Reviewed that we will stay on top of his appointments and he will need to do the same.  Scheduled out through November for now. Will check his HIV RNA at his next injection visit.   Administered cabotegravir 600mg /63mL in left upper outer quadrant of the gluteal muscle. Administered rilpivirine 900 mg/82mL in the right upper outer quadrant of the gluteal muscle. Monitored patient for 10 minutes after injection. Injections were tolerated well without issue. Counseled to stop taking Symtuza after today's dose and to call with any issues that may arise. Will make follow up appointments for second initiation injection in 30 days and then maintenance injections every 2 months thereafter for 6 months.   Reviewed that he is eligible to complete his HPV  vaccination series and receive his updated COVID and PCV20 vaccinations; he would like to receive these at his next appointment. States he received his Menveo series in [place] probably ~2019 and received his last booster in 2023; no need for repeat Menveo doses at this time.  Plan: - Stop Symtuza - First Cabenuva injections administered - Second initiation injection scheduled for 7/26 with me  - Maintenance injections scheduled for 9/23 with Judeth Cornfield and 11/20 with me  - Call with any issues or questions  Margarite Gouge, PharmD, CPP, BCIDP, AAHIVP Clinical Pharmacist Practitioner Infectious Diseases Clinical Pharmacist [facility]

## 2023-05-19 ENCOUNTER — Ambulatory Visit (INDEPENDENT_AMBULATORY_CARE_PROVIDER_SITE_OTHER): Payer: BC Managed Care – PPO | Admitting: Pharmacist

## 2023-05-19 ENCOUNTER — Other Ambulatory Visit: Payer: Self-pay

## 2023-05-19 DIAGNOSIS — Z23 Encounter for immunization: Secondary | ICD-10-CM

## 2023-05-19 DIAGNOSIS — B20 Human immunodeficiency virus [HIV] disease: Secondary | ICD-10-CM

## 2023-05-19 MED ORDER — CABOTEGRAVIR & RILPIVIRINE ER 600 & 900 MG/3ML IM SUER
1.0000 | Freq: Once | INTRAMUSCULAR | Status: AC
  Administered 2023-05-19: 1 via INTRAMUSCULAR

## 2023-05-19 NOTE — Progress Notes (Signed)
HPI: Justin [name] is a 42 y.o. male who presents to the [facility] pharmacy clinic for [place] administration.  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   BETAMETHASONE VALERATE (VALISONE) 0.1 % CREAM    Apply topically 2 (two) times daily.   HYDROXYZINE (ATARAX/VISTARIL) 25 MG TABLET    Take 25 mg by mouth 2 (two) times daily as needed.   NALTREXONE (DEPADE) 50 MG TABLET    Take 1 tablet (50 mg total) by mouth daily.   PRAZOSIN (MINIPRESS) 1 MG CAPSULE    Take 1 mg by mouth at bedtime.   TRAZODONE (DESYREL) 100 MG TABLET    TAKE ONE TABLET BY MOUTH AT BEDTIME MAY REPEAT SAME DOSE ONE TIME IF STILL AWAKE AN HOUR AFTER INITIAL DOSE.   VALACYCLOVIR (VALTREX) 500 MG TABLET    Take 500 mg by mouth daily.   VENLAFAXINE XR (EFFEXOR-XR) 150 MG 24 HR CAPSULE    TAKE 1 CAPSULE BY MOUTH EVERY DAY  Modified Medications   No medications on file  Discontinued Medications   No medications on file    Allergies: No Known Allergies  Past Medical History: Past Medical History:  Diagnosis Date   Colitis    HIV (human immunodeficiency virus infection) (HCC)    HIV (human immunodeficiency virus infection) (HCC)    Hyperlipidemia    Preproliferative diabetic retinopathy (HCC)     Social History: Social History   Socioeconomic History   Marital status: Married    Spouse name: Not on  file   Number of children: 0   Years of education: Not on file   Highest education level: Not on file  Occupational History   Not on file  Tobacco Use   Smoking status: Every Day    Types: E-cigarettes   Smokeless tobacco: Never  Vaping Use   Vaping status: Every Day   Substances: Nicotine  Substance and Sexual Activity   Alcohol use: Not Currently   Drug use: Not Currently   Sexual activity: Yes    Partners: Male    Birth control/protection: None    Comment: declined condoms  Other Topics Concern   Not on file  Social History Narrative   Not on file   Social Determinants of Health   Financial Resource Strain: Not on file  Food Insecurity: Not on file  Transportation Needs: Not on file  Physical Activity: Not on file  Stress: Not on file (12/02/2019)  Social Connections: Not on file    Labs: Lab Results  Component Value Date   HIV1RNAQUANT NOT DETECTED 03/11/2022   HIV1RNAQUANT Not Detected 01/07/2022   HIV1RNAQUANT Not Detected 11/11/2021   CD4TABS 1,440 03/07/2023   CD4TABS 913 07/12/2021   CD4TABS 1,044 04/14/2021    RPR and STI Lab Results  Component Value Date   LABRPR REACTIVE (A) 03/01/2023   LABRPR Reactive (A) 06/10/2022  LABRPR REACTIVE (A) 01/07/2022   LABRPR REACTIVE (A) 07/12/2021   LABRPR Reactive (A) 01/26/2021   RPRTITER 1:2 (H) 03/01/2023   RPRTITER 1:2 (H) 01/07/2022   RPRTITER 1:4 (H) 07/12/2021    STI Results GC CT  03/01/2023  1:53 PM Negative  Negative   03/01/2023  1:51 PM Negative  Negative   03/01/2023  1:42 PM Negative  Negative   06/10/2022  3:14 PM Negative    Negative  Negative    Negative   09/15/2021 11:20 AM Negative  Negative   07/12/2021  3:41 PM Negative    Negative    Negative  Negative    Negative    Negative   04/14/2021  3:52 PM Negative  Negative   01/26/2021  2:32 PM Negative  Negative   04/11/2020 11:04 AM Negative  Negative     Hepatitis B Lab Results  Component Value Date   HEPBSAB REACTIVE (A)  04/14/2021   HEPBSAG NON-REACTIVE 04/14/2021   HEPBCAB NON-REACTIVE 04/14/2021   Hepatitis C Lab Results  Component Value Date   HEPCAB REACTIVE (A) 04/14/2021   HCVRNAPCRQN <15 NOT DETECTED 04/14/2021   Hepatitis A Lab Results  Component Value Date   HAV REACTIVE (A) 04/14/2021   Lipids: Lab Results  Component Value Date   CHOL 129 04/14/2021   TRIG 180 (H) 04/14/2021   HDL 27 (L) 04/14/2021   CHOLHDL 4.8 04/14/2021   LDLCALC 74 04/14/2021    TARGET DATE:  The 27th of the month  Current HIV Regimen: Cabenuva  Assessment: Pierino presents today for their second Cabenuva loading injections. Initial/past injections were tolerated well without issues. No problems with systemic effects of injections.   Administered cabotegravir 600mg /54mL in left upper outer quadrant of the gluteal muscle. Administered rilpivirine 900 mg/89mL in the right upper outer quadrant of the gluteal muscle. Monitored patient for 10 minutes after injection. Injections were tolerated well without issue. Patient will follow up in 2 months for next injection. Will check HIV RNA today; patient politely declines STI testing.   Due for 3rd HPV and PCV20 vaccines which were both administered today.   Plan: - Cabenuva injections administered - Check HIV RNA - Administer 3/3 HPV and PCV20  - Next injections scheduled for 9/23 with Judeth Cornfield and 11/27 with me  - Call with any issues or questions  Margarite Gouge, PharmD, CPP, BCIDP, AAHIVP Clinical Pharmacist Practitioner Infectious Diseases Clinical Pharmacist [facility]

## 2023-07-17 ENCOUNTER — Ambulatory Visit (INDEPENDENT_AMBULATORY_CARE_PROVIDER_SITE_OTHER): Payer: BC Managed Care – PPO | Admitting: Infectious Diseases

## 2023-07-17 ENCOUNTER — Encounter: Payer: Self-pay | Admitting: Infectious Diseases

## 2023-07-17 ENCOUNTER — Other Ambulatory Visit (HOSPITAL_COMMUNITY)
Admission: RE | Admit: 2023-07-17 | Discharge: 2023-07-17 | Disposition: A | Discharge status: Home / Self Care | Payer: BC Managed Care – PPO | Source: Ambulatory Visit | Attending: Infectious Diseases | Admitting: Infectious Diseases

## 2023-07-17 ENCOUNTER — Other Ambulatory Visit: Payer: Self-pay

## 2023-07-17 VITALS — BP 112/74 | HR 68 | Temp 98.1°F | Ht 60.0 in | Wt 166.0 lb

## 2023-07-17 DIAGNOSIS — Z113 Encounter for screening for infections with a predominantly sexual mode of transmission: Secondary | ICD-10-CM | POA: Diagnosis present

## 2023-07-17 DIAGNOSIS — R197 Diarrhea, unspecified: Secondary | ICD-10-CM | POA: Insufficient documentation

## 2023-07-17 DIAGNOSIS — A530 Latent syphilis, unspecified as early or late: Secondary | ICD-10-CM | POA: Diagnosis not present

## 2023-07-17 DIAGNOSIS — Z23 Encounter for immunization: Secondary | ICD-10-CM | POA: Diagnosis not present

## 2023-07-17 DIAGNOSIS — B20 Human immunodeficiency virus [HIV] disease: Secondary | ICD-10-CM

## 2023-07-17 DIAGNOSIS — Z0000 Encounter for general adult medical examination without abnormal findings: Secondary | ICD-10-CM | POA: Insufficient documentation

## 2023-07-17 MED ORDER — CABOTEGRAVIR & RILPIVIRINE ER 600 & 900 MG/3ML IM SUER
1.0000 | Freq: Once | INTRAMUSCULAR | Status: AC
  Administered 2023-07-17: 1 via INTRAMUSCULAR

## 2023-07-17 NOTE — Assessment & Plan Note (Addendum)
Flu vaccine, covid vaccine today  Will need discussion for anal cancer screening next OV.  Statin discussion for primary prevention heart disease as well.

## 2023-07-17 NOTE — Assessment & Plan Note (Signed)
Re screen titer today

## 2023-07-17 NOTE — Patient Instructions (Addendum)
Nice to see you!  Silicone scar tape at night to help lighten the scar.   09/20/2023 is your next shot date with Marchelle Folks.

## 2023-07-17 NOTE — Assessment & Plan Note (Signed)
Urine testing in [place] negative - resolved after empiric ceftriaxone + azithro dose.  Triple screen today but I suspect this was more food related.

## 2023-07-17 NOTE — Addendum Note (Signed)
Addended by: Philippa Chester on: 07/17/2023 01:05 PM   Modules accepted: Orders

## 2023-07-17 NOTE — Progress Notes (Signed)
Name: Justin [name]  DOB: Jan 21, 1981 MRN: [id number] PCP: Leilani Able, MD    Brief Narrative:  Justin [name] is a 42 y.o. male with HIV dx 04/11/2007. Started medications in 2009.  In care with [facility] 2016 - 2021.  CD4 nadir > 200 VL < 20  HIV Risk: sexual, msm History of OIs: none Intake Labs 03/2021: Hep B sAg (-), sAb (+), cAb (-); Hep A (+), Hep C (+, RNA - 03/2021) Quantiferon (-) HLA B*5701 (-) G6PD: ()   Previous Regimens: Atripla 2009 - 2016 Triumeq, 2017 - 2022  Cabenuva 05/2021    Genotypes: None on file  Subjective:   Chief Complaint  Patient presents with   Follow-up     HPI: Justin [name] is here for follow up HIV care after getting back on Cabenuva.   Noticed some increase in depression symptoms but has had some other acute illnesses recently that contributed. Had food poisoning recently, then had liquid diarrhea lasted 3d recently.  Went to [facility] in [place] and was treated for GC/CT - recommended to get more testing. Diarrhea has resolved.       07/17/2023   11:16 AM  Depression screen PHQ 2/9  Decreased Interest 0  Down, Depressed, Hopeless 0  PHQ - 2 Score 0     Review of Systems  Constitutional:  Negative for chills, fever, malaise/fatigue and weight loss.  HENT:  Negative for sore throat.        No dental problems  Respiratory:  Negative for cough and sputum production.   Cardiovascular:  Negative for chest pain and leg swelling.  Gastrointestinal:  Negative for abdominal pain, diarrhea and vomiting.  Genitourinary:  Negative for dysuria and flank pain.  Musculoskeletal:  Negative for joint pain, myalgias and neck pain.  Skin:  Negative for rash.  Neurological:  Negative for dizziness, tingling and headaches.  Psychiatric/Behavioral:  Negative for depression and substance abuse. The patient is not nervous/anxious and does not have insomnia.   All other systems reviewed and are negative.    Past Medical  History:  Diagnosis Date   Colitis    HIV (human immunodeficiency virus infection) (HCC)    HIV (human immunodeficiency virus infection) (HCC)    Hyperlipidemia    Preproliferative diabetic retinopathy (HCC)     Outpatient Medications Prior to Visit  Medication Sig Dispense Refill   atorvastatin (LIPITOR) 10 MG tablet Take 10 mg by mouth daily.     prazosin (MINIPRESS) 1 MG capsule Take 1 mg by mouth at bedtime.     traZODone (DESYREL) 100 MG tablet TAKE ONE TABLET BY MOUTH AT BEDTIME MAY REPEAT SAME DOSE ONE TIME IF STILL AWAKE AN HOUR AFTER INITIAL DOSE.     valACYclovir (VALTREX) 500 MG tablet Take 500 mg by mouth daily.     venlafaxine XR (EFFEXOR-XR) 150 MG 24 hr capsule TAKE 1 CAPSULE BY MOUTH EVERY DAY 90 capsule 1   betamethasone valerate (VALISONE) 0.1 % cream Apply topically 2 (two) times daily. (Patient not taking: Reported on 07/17/2023) 30 g 1   hydrOXYzine (ATARAX/VISTARIL) 25 MG tablet Take 25 mg by mouth 2 (two) times daily as needed. (Patient not taking: Reported on 07/17/2023)     naltrexone (DEPADE) 50 MG tablet Take 1 tablet (50 mg total) by mouth daily. (Patient not taking: Reported on 11/11/2021) 30 tablet 2   No facility-administered medications prior to visit.     No Known Allergies  Social History   Tobacco Use  Smoking status: Every Day    Types: E-cigarettes   Smokeless tobacco: Never  Vaping Use   Vaping status: Every Day   Substances: Nicotine  Substance Use Topics   Alcohol use: Not Currently   Drug use: Not Currently     Social History   Substance and Sexual Activity  Sexual Activity Yes   Partners: Male   Birth control/protection: None   Comment: declined condoms     Objective:   Vitals:   07/17/23 1115  BP: 112/74  Pulse: 68  Temp: 98.1 F (36.7 C)  TempSrc: Temporal  SpO2: 100%  Weight: 166 lb (75.3 kg)  Height: 5' (1.524 m)   Body mass index is 32.42 kg/m.  Physical Exam Vitals reviewed.  Constitutional:       Appearance: Normal appearance. He is not ill-appearing.  HENT:     Head: Normocephalic.     Mouth/Throat:     Mouth: Mucous membranes are moist.     Pharynx: Oropharynx is clear.  Eyes:     General: No scleral icterus. Cardiovascular:     Rate and Rhythm: Normal rate and regular rhythm.  Pulmonary:     Effort: Pulmonary effort is normal.  Musculoskeletal:        General: Normal range of motion.     Cervical back: Normal range of motion.  Skin:    Coloration: Skin is not jaundiced or pale.  Neurological:     Mental Status: He is alert and oriented to person, place, and time.  Psychiatric:        Mood and Affect: Mood normal.        Judgment: Judgment normal.     Lab Results Lab Results  Component Value Date   WBC 8.6 03/07/2023   HGB 13.4 03/07/2023   HCT 43.8 03/07/2023   MCV 75.8 (L) 03/07/2023   PLT 251 03/07/2023    Lab Results  Component Value Date   CREATININE 1.00 04/14/2021   BUN 12 04/14/2021   NA 142 04/14/2021   K 4.2 04/14/2021   CL 107 04/14/2021   CO2 25 04/14/2021    Lab Results  Component Value Date   ALT 52 (H) 04/14/2021   AST 32 04/14/2021   BILITOT 0.3 04/14/2021    Lab Results  Component Value Date   CHOL 129 04/14/2021   HDL 27 (L) 04/14/2021   LDLCALC 74 04/14/2021   TRIG 180 (H) 04/14/2021   CHOLHDL 4.8 04/14/2021   HIV 1 RNA Quant  Date Value  05/19/2023 Not Detected Copies/mL  03/11/2022 NOT DETECTED copies/mL  01/07/2022 Not Detected Copies/mL   CD4 T Cell Abs (/uL)  Date Value  03/07/2023 1,440  07/12/2021 913  04/14/2021 1,044     Assessment & Plan:   Problem List Items Addressed This Visit       Unprioritized   Acute diarrhea    Urine testing in [place] negative - resolved after empiric ceftriaxone + azithro dose.  Triple screen today but I suspect this was more food related.       Healthcare maintenance    Flu vaccine, covid vaccine today  Will need discussion for anal cancer screening next OV.  Statin  discussion for primary prevention heart disease as well.      HIV infection (HCC) - Primary    Very well controlled on Cabenuva injections. No concerns with access or adherence to medication. They are tolerating the medication well without side effects. No drug interactions identified. Pertinent lab tests ordered  today.  No changes to insurance coverage.  No dental needs today.  No concern over anxious/depressed mood.  Sexual health and family planning discussed - asymptomatic screening collected today.  Vaccines updated today - see health maintenance section.   09/20/2023 for his next injection TD 27th        Relevant Orders   HIV 1 RNA quant-no reflex-bld   Latent syphilis    Re screen titer today       Other Visit Diagnoses     Screening examination for STI       Relevant Orders   Cytology (oral, anal, urethral) ancillary only   Urine cytology ancillary only   Cytology (oral, anal, urethral) ancillary only   RPR       Rexene Alberts, MSN, NP-C [facility] for Infectious Disease [facility] Pager: [phone] Office: [phone]   07/17/23  12:29 PM

## 2023-07-17 NOTE — Assessment & Plan Note (Signed)
Very well controlled on Cabenuva injections. No concerns with access or adherence to medication. They are tolerating the medication well without side effects. No drug interactions identified. Pertinent lab tests ordered today.  No changes to insurance coverage.  No dental needs today.  No concern over anxious/depressed mood.  Sexual health and family planning discussed - asymptomatic screening collected today.  Vaccines updated today - see health maintenance section.   09/20/2023 for his next injection TD 27th

## 2023-07-18 LAB — URINE CYTOLOGY ANCILLARY ONLY
Chlamydia: NEGATIVE
Comment: NEGATIVE
Comment: NORMAL
Neisseria Gonorrhea: NEGATIVE

## 2023-07-18 LAB — CYTOLOGY, (ORAL, ANAL, URETHRAL) ANCILLARY ONLY
Chlamydia: NEGATIVE
Chlamydia: NEGATIVE
Comment: NEGATIVE
Comment: NORMAL
Comment: NEGATIVE
Comment: NORMAL
Neisseria Gonorrhea: NEGATIVE
Neisseria Gonorrhea: NEGATIVE

## 2023-07-19 LAB — RPR TITER: RPR Titer: 1:8 {titer} — ABNORMAL HIGH

## 2023-07-19 LAB — HIV-1 RNA QUANT-NO REFLEX-BLD
HIV 1 RNA Quant: NOT DETECTED Copies/mL
HIV-1 RNA Quant, Log: NOT DETECTED Log cps/mL

## 2023-07-19 LAB — RPR: RPR Ser Ql: REACTIVE — AB

## 2023-07-19 LAB — T PALLIDUM AB: T Pallidum Abs: POSITIVE — AB

## 2023-07-20 ENCOUNTER — Telehealth: Payer: Self-pay

## 2023-07-20 NOTE — Telephone Encounter (Signed)
Spoke with pt. Scheduled for inj tomorrow.

## 2023-07-20 NOTE — Telephone Encounter (Signed)
Attempted to call patient regarding RPR results. Not able to reach him at this time. Will send mychart message requesting call back. Juanita Laster, RMA

## 2023-07-20 NOTE — Telephone Encounter (Signed)
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

## 2023-07-21 ENCOUNTER — Ambulatory Visit (INDEPENDENT_AMBULATORY_CARE_PROVIDER_SITE_OTHER): Payer: BC Managed Care – PPO

## 2023-07-21 ENCOUNTER — Other Ambulatory Visit: Payer: Self-pay

## 2023-07-21 DIAGNOSIS — A530 Latent syphilis, unspecified as early or late: Secondary | ICD-10-CM | POA: Diagnosis not present

## 2023-07-21 MED ORDER — PENICILLIN G BENZATHINE 1200000 UNIT/2ML IM SUSY
2.4000 10*6.[IU] | PREFILLED_SYRINGE | Freq: Once | INTRAMUSCULAR | Status: AC
  Administered 2023-07-21: 2.4 10*6.[IU] via INTRAMUSCULAR

## 2023-09-13 ENCOUNTER — Ambulatory Visit: Payer: Self-pay | Admitting: Pharmacist

## 2023-09-19 NOTE — Progress Notes (Unsigned)
HPI: Justin [name] is a 42 y.o. male who presents to the [facility] pharmacy clinic for [place] administration.  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   BETAMETHASONE VALERATE (VALISONE) 0.1 % CREAM    Apply topically 2 (two) times daily.   HYDROXYZINE (ATARAX/VISTARIL) 25 MG TABLET    Take 25 mg by mouth 2 (two) times daily as needed.   NALTREXONE (DEPADE) 50 MG TABLET    Take 1 tablet (50 mg total) by mouth daily.   PRAZOSIN (MINIPRESS) 1 MG CAPSULE    Take 1 mg by mouth at bedtime.   TRAZODONE (DESYREL) 100 MG TABLET    TAKE ONE TABLET BY MOUTH AT BEDTIME MAY REPEAT SAME DOSE ONE TIME IF STILL AWAKE AN HOUR AFTER INITIAL DOSE.   VALACYCLOVIR (VALTREX) 500 MG TABLET    Take 500 mg by mouth daily.   VENLAFAXINE XR (EFFEXOR-XR) 150 MG 24 HR CAPSULE    TAKE 1 CAPSULE BY MOUTH EVERY DAY  Modified Medications   No medications on file  Discontinued Medications   No medications on file    Allergies: No Known Allergies  Labs: Lab Results  Component Value Date   HIV1RNAQUANT Not Detected 07/17/2023   HIV1RNAQUANT Not Detected 05/19/2023   HIV1RNAQUANT NOT DETECTED 03/11/2022   CD4TABS 1,440 03/07/2023   CD4TABS 913 07/12/2021   CD4TABS 1,044 04/14/2021    RPR and STI Lab Results  Component Value Date    LABRPR REACTIVE (A) 07/17/2023   LABRPR REACTIVE (A) 03/01/2023   LABRPR Reactive (A) 06/10/2022   LABRPR REACTIVE (A) 01/07/2022   LABRPR REACTIVE (A) 07/12/2021   RPRTITER 1:8 (H) 07/17/2023   RPRTITER 1:2 (H) 03/01/2023   RPRTITER 1:2 (H) 01/07/2022   RPRTITER 1:4 (H) 07/12/2021    STI Results GC CT  07/17/2023 11:30 AM Negative    Negative  Negative    Negative   07/17/2023 11:29 AM Negative  Negative   03/01/2023  1:53 PM Negative  Negative   03/01/2023  1:51 PM Negative  Negative   03/01/2023  1:42 PM Negative  Negative   06/10/2022  3:14 PM Negative    Negative  Negative    Negative   09/15/2021 11:20 AM Negative  Negative   07/12/2021  3:41 PM Negative    Negative    Negative  Negative    Negative    Negative   04/14/2021  3:52 PM Negative  Negative   01/26/2021  2:32 PM Negative  Negative   04/11/2020 11:04 AM Negative  Negative     Hepatitis B Lab Results  Component Value Date   HEPBSAB REACTIVE (A) 04/14/2021   HEPBSAG NON-REACTIVE 04/14/2021   HEPBCAB NON-REACTIVE 04/14/2021   Hepatitis C Lab Results  Component Value Date   HEPCAB REACTIVE (A) 04/14/2021   HCVRNAPCRQN <15  NOT DETECTED 04/14/2021   Hepatitis A Lab Results  Component Value Date   HAV REACTIVE (A) 04/14/2021   Lipids: Lab Results  Component Value Date   CHOL 129 04/14/2021   TRIG 180 (H) 04/14/2021   HDL 27 (L) 04/14/2021   CHOLHDL 4.8 04/14/2021   LDLCALC 74 04/14/2021    TARGET DATE: The 27th of the month  Assessment: Pratheek presents today for his maintenance Cabenuva injections. Past injections were tolerated well without issues. Last provider visit was with Judeth Cornfield on 07/17/23, where he was undetectable. Treated in September also for syphilis. He reports no concerns at this time. He requests STI testing today. Chesney is up to date on immunizations. Will schedule back with Judeth Cornfield in March.  Administered cabotegravir 600mg /28mL in left upper outer quadrant of the  gluteal muscle. Administered rilpivirine 900 mg/37mL in the right upper outer quadrant of the gluteal muscle. No issues with injections. Averill will follow up in 2 months for next set of injections.  With new research coming out by way of the REPRIEVE study regarding cardiovascular event risk reduction for PLWH, I discussed that over the 8 year trial initiation of statin (pitavastatin) therapy was shown to reduce CV disease by 35% independent of other risk factors.    The 10-year ASCVD risk score (Arnett DK, et al., 2019) is: 2.6%    Values used to calculate the score:      Age: 50 years      Sex: Male      Is Non-Hispanic African American: Yes      Diabetic: No      Tobacco smoker: No      Systolic Blood Pressure: 112 mmHg      Is BP treated: No      HDL Cholesterol: 78 mg/dL     (4132) Total Cholesterol: 130 mg/dL   (4401)  We discussed the patient's 10-year CVD Risk Score to be at least low to moderate risk, and would benefit from intervention given HIV+ and > 13 yo. He was actually prescribed atorvastatin by his PCP earlier this year but only filled it once. He is happy to continue. Discussed with him unlikely risk of rhabdomyolysis. Will send him a script for his previously prescribed atorvastatin 10 mg daily to [facility].  Plan: - Cabenuva injections administered - Atorvastatin 10 mg daily to [facility] outpatient pharmacy - Next injections scheduled for 11/14/23 with Marchelle Folks and 01/22/24 with Judeth Cornfield - Call with any issues or questions  Lora Paula, PharmD PGY-2 Infectious Diseases Pharmacy Resident [facility] Infectious Disease

## 2023-09-20 ENCOUNTER — Other Ambulatory Visit (HOSPITAL_COMMUNITY): Payer: Self-pay

## 2023-09-20 ENCOUNTER — Other Ambulatory Visit (HOSPITAL_COMMUNITY)
Admission: RE | Admit: 2023-09-20 | Discharge: 2023-09-20 | Disposition: A | Discharge status: Home / Self Care | Payer: BC Managed Care – PPO | Source: Ambulatory Visit | Attending: Infectious Diseases | Admitting: Infectious Diseases

## 2023-09-20 ENCOUNTER — Ambulatory Visit: Payer: BC Managed Care – PPO | Admitting: Pharmacist

## 2023-09-20 ENCOUNTER — Other Ambulatory Visit: Payer: Self-pay

## 2023-09-20 DIAGNOSIS — Z113 Encounter for screening for infections with a predominantly sexual mode of transmission: Secondary | ICD-10-CM

## 2023-09-20 DIAGNOSIS — B20 Human immunodeficiency virus [HIV] disease: Secondary | ICD-10-CM

## 2023-09-20 MED ORDER — ATORVASTATIN CALCIUM 10 MG PO TABS
10.0000 mg | ORAL_TABLET | Freq: Every day | ORAL | 5 refills | Status: DC
  Filled 2023-09-20 – 2023-09-28 (×2): qty 30, 30d supply, fill #0

## 2023-09-20 MED ORDER — CABOTEGRAVIR & RILPIVIRINE ER 600 & 900 MG/3ML IM SUER
1.0000 | Freq: Once | INTRAMUSCULAR | Status: AC
  Administered 2023-09-20: 1 via INTRAMUSCULAR

## 2023-09-22 LAB — CYTOLOGY, (ORAL, ANAL, URETHRAL) ANCILLARY ONLY
Chlamydia: NEGATIVE
Chlamydia: NEGATIVE
Comment: NEGATIVE
Comment: NEGATIVE
Comment: NORMAL
Comment: NORMAL
Neisseria Gonorrhea: NEGATIVE
Neisseria Gonorrhea: NEGATIVE

## 2023-09-22 LAB — URINE CYTOLOGY ANCILLARY ONLY
Chlamydia: NEGATIVE
Comment: NEGATIVE
Comment: NORMAL
Neisseria Gonorrhea: NEGATIVE

## 2023-09-23 LAB — RPR: RPR Ser Ql: REACTIVE — AB

## 2023-09-23 LAB — T PALLIDUM AB: T Pallidum Abs: POSITIVE — AB

## 2023-09-23 LAB — RPR TITER: RPR Titer: 1:2 {titer} — ABNORMAL HIGH

## 2023-09-28 ENCOUNTER — Other Ambulatory Visit (HOSPITAL_COMMUNITY): Payer: Self-pay

## 2023-09-28 ENCOUNTER — Other Ambulatory Visit: Payer: Self-pay

## 2023-11-13 NOTE — Progress Notes (Unsigned)
HPI: Justin [name] is a 43 y.o. male who presents to the [facility] pharmacy clinic for [place] administration.  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   BETAMETHASONE VALERATE (VALISONE) 0.1 % CREAM    Apply topically 2 (two) times daily.   HYDROXYZINE (ATARAX/VISTARIL) 25 MG TABLET    Take 25 mg by mouth 2 (two) times daily as needed.   NALTREXONE (DEPADE) 50 MG TABLET    Take 1 tablet (50 mg total) by mouth daily.   PRAZOSIN (MINIPRESS) 1 MG CAPSULE    Take 1 mg by mouth at bedtime.   TRAZODONE (DESYREL) 100 MG TABLET    TAKE ONE TABLET BY MOUTH AT BEDTIME MAY REPEAT SAME DOSE ONE TIME IF STILL AWAKE AN HOUR AFTER INITIAL DOSE.   VALACYCLOVIR (VALTREX) 500 MG TABLET    Take 500 mg by mouth daily.   VENLAFAXINE XR (EFFEXOR-XR) 150 MG 24 HR CAPSULE    TAKE 1 CAPSULE BY MOUTH EVERY DAY  Modified Medications   No medications on file  Discontinued Medications   No medications on file    Allergies: No Known Allergies  Labs: Lab Results  Component Value Date   HIV1RNAQUANT Not Detected 07/17/2023   HIV1RNAQUANT Not Detected 05/19/2023   HIV1RNAQUANT NOT DETECTED 03/11/2022   CD4TABS 1,440 03/07/2023   CD4TABS 913 07/12/2021   CD4TABS 1,044 04/14/2021    RPR and STI Lab Results   Component Value Date   LABRPR REACTIVE (A) 09/20/2023   LABRPR REACTIVE (A) 07/17/2023   LABRPR REACTIVE (A) 03/01/2023   LABRPR Reactive (A) 06/10/2022   LABRPR REACTIVE (A) 01/07/2022   RPRTITER 1:2 (H) 09/20/2023   RPRTITER 1:8 (H) 07/17/2023   RPRTITER 1:2 (H) 03/01/2023   RPRTITER 1:2 (H) 01/07/2022   RPRTITER 1:4 (H) 07/12/2021    STI Results GC CT  09/20/2023  9:46 AM Negative    Negative    Negative  Negative    Negative    Negative   07/17/2023 11:30 AM Negative    Negative  Negative    Negative   07/17/2023 11:29 AM Negative  Negative   03/01/2023  1:53 PM Negative  Negative   03/01/2023  1:51 PM Negative  Negative   03/01/2023  1:42 PM Negative  Negative   06/10/2022  3:14 PM Negative    Negative  Negative    Negative   09/15/2021 11:20 AM Negative  Negative   07/12/2021  3:41 PM Negative    Negative    Negative  Negative    Negative    Negative   04/14/2021  3:52 PM Negative  Negative   01/26/2021  2:32 PM Negative  Negative   04/11/2020 11:04 AM Negative  Negative     Hepatitis B Lab Results  Component Value Date  HEPBSAB REACTIVE (A) 04/14/2021   HEPBSAG NON-REACTIVE 04/14/2021   HEPBCAB NON-REACTIVE 04/14/2021   Hepatitis C Lab Results  Component Value Date   HEPCAB REACTIVE (A) 04/14/2021   HCVRNAPCRQN <15 NOT DETECTED 04/14/2021   Hepatitis A Lab Results  Component Value Date   HAV REACTIVE (A) 04/14/2021   Lipids: Lab Results  Component Value Date   CHOL 129 04/14/2021   TRIG 180 (H) 04/14/2021   HDL 27 (L) 04/14/2021   CHOLHDL 4.8 04/14/2021   LDLCALC 74 04/14/2021    TARGET DATE: 27th day of the month  Assessment: Justin [name] presents today for their  maintenance Cabenuva injections. Past injections were tolerated well without issues. Last HIV RNA was undetectable in 06/2023. Most recent CD4 1440 from 02/2023. Last STI test from 09/20/23 with RPR 1:2 and oral/urine/rectal cytologies negative for chlamydia/gonorrhea. Denies recent  STI exposure. Patient agrees for full STI testing today with RPR and oral/urine/rectal cytologies to test for syphilis, chlamydia, and gonorrhea.   Labs: most recent CMP and Lipids 03/2021 WNL except ALT 52 and TG 180. Will collect CMP and lipid panel today. Will also do HIV RNA today with other labs.  Vaccination: up to date   Administered cabotegravir 600mg /51mL in left upper outer quadrant of the gluteal muscle. Administered rilpivirine 900 mg/27mL in the right upper outer quadrant of the gluteal muscle. No issues with injections. They will follow up in 2 months for next set of injections.  Plan: - Cabenuva injections administered - HIV RNA, CMP and Lipid panel - STI testing with RPR and oral/urine/rectal cytologies  - Next injections scheduled with Justin [name] on 01/22/24 and with Justin [name] on 03/12/24 - Call with any issues or questions  Dimple Casey. Edwena Blow, PharmD Candidate [facility] [employer]

## 2023-11-14 ENCOUNTER — Other Ambulatory Visit (HOSPITAL_COMMUNITY)
Admission: RE | Admit: 2023-11-14 | Discharge: 2023-11-14 | Disposition: A | Discharge status: Home / Self Care | Payer: BC Managed Care – PPO | Source: Ambulatory Visit | Attending: Infectious Diseases | Admitting: Infectious Diseases

## 2023-11-14 ENCOUNTER — Other Ambulatory Visit: Payer: Self-pay

## 2023-11-14 ENCOUNTER — Ambulatory Visit: Payer: BC Managed Care – PPO | Admitting: Pharmacist

## 2023-11-14 DIAGNOSIS — Z113 Encounter for screening for infections with a predominantly sexual mode of transmission: Secondary | ICD-10-CM

## 2023-11-14 DIAGNOSIS — B20 Human immunodeficiency virus [HIV] disease: Secondary | ICD-10-CM

## 2023-11-14 MED ORDER — CABOTEGRAVIR & RILPIVIRINE ER 600 & 900 MG/3ML IM SUER
1.0000 | Freq: Once | INTRAMUSCULAR | Status: AC
  Administered 2023-11-14: 1 via INTRAMUSCULAR

## 2023-11-15 LAB — CYTOLOGY, (ORAL, ANAL, URETHRAL) ANCILLARY ONLY
Chlamydia: NEGATIVE
Chlamydia: NEGATIVE
Comment: NEGATIVE
Comment: NEGATIVE
Comment: NORMAL
Comment: NORMAL
Neisseria Gonorrhea: NEGATIVE
Neisseria Gonorrhea: NEGATIVE

## 2023-11-15 LAB — URINE CYTOLOGY ANCILLARY ONLY
Chlamydia: NEGATIVE
Comment: NEGATIVE
Comment: NORMAL
Neisseria Gonorrhea: NEGATIVE

## 2023-11-17 ENCOUNTER — Encounter: Payer: Self-pay | Admitting: Pharmacist

## 2023-11-17 LAB — RPR: RPR Ser Ql: REACTIVE — AB

## 2023-11-17 LAB — COMPREHENSIVE METABOLIC PANEL WITH GFR
AG Ratio: 1.5 (calc) (ref 1.0–2.5)
ALT: 19 U/L (ref 9–46)
AST: 15 U/L (ref 10–40)
Albumin: 4.8 g/dL (ref 3.6–5.1)
Alkaline phosphatase (APISO): 51 U/L (ref 36–130)
BUN: 12 mg/dL (ref 7–25)
CO2: 31 mmol/L (ref 20–32)
Calcium: 10.4 mg/dL — ABNORMAL HIGH (ref 8.6–10.3)
Chloride: 102 mmol/L (ref 98–110)
Creat: 0.97 mg/dL (ref 0.60–1.29)
Globulin: 3.2 g/dL (ref 1.9–3.7)
Glucose, Bld: 96 mg/dL (ref 65–99)
Potassium: 3.7 mmol/L (ref 3.5–5.3)
Sodium: 142 mmol/L (ref 135–146)
Total Bilirubin: 0.4 mg/dL (ref 0.2–1.2)
Total Protein: 8 g/dL (ref 6.1–8.1)

## 2023-11-17 LAB — LIPID PANEL
Cholesterol: 193 mg/dL (ref <200)
HDL: 38 mg/dL — ABNORMAL LOW (ref >=40)
LDL Cholesterol (Calc): 125 mg/dL — ABNORMAL HIGH
Non-HDL Cholesterol (Calc): 155 mg/dL — ABNORMAL HIGH (ref <130)
Total CHOL/HDL Ratio: 5.1 (calc) — ABNORMAL HIGH (ref <5.0)
Triglycerides: 182 mg/dL — ABNORMAL HIGH (ref <150)

## 2023-11-17 LAB — HIV-1 RNA QUANT-NO REFLEX-BLD
HIV 1 RNA Quant: NOT DETECTED {copies}/mL
HIV-1 RNA Quant, Log: NOT DETECTED {Log_copies}/mL

## 2023-11-17 LAB — T PALLIDUM AB: T Pallidum Abs: POSITIVE — AB

## 2023-11-17 LAB — RPR TITER: RPR Titer: 1:4 {titer} — ABNORMAL HIGH

## 2023-11-20 ENCOUNTER — Other Ambulatory Visit (HOSPITAL_COMMUNITY): Payer: Self-pay

## 2023-11-21 ENCOUNTER — Other Ambulatory Visit (HOSPITAL_COMMUNITY): Payer: Self-pay

## 2024-01-22 ENCOUNTER — Ambulatory Visit: Payer: BC Managed Care – PPO | Admitting: Infectious Diseases

## 2024-01-22 NOTE — Progress Notes (Unsigned)
 HPI: Justin [name] is a 43 y.o. male who presents to the [facility] pharmacy clinic for [place] administration.  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   PRAZOSIN (MINIPRESS) 1 MG CAPSULE    Take 1 mg by mouth at bedtime.   VALACYCLOVIR (VALTREX) 500 MG TABLET    Take 500 mg by mouth daily.   VENLAFAXINE XR (EFFEXOR-XR) 150 MG 24 HR CAPSULE    TAKE 1 CAPSULE BY MOUTH EVERY DAY  Modified Medications   No medications on file  Discontinued Medications   No medications on file    Allergies: No Known Allergies  Labs: Lab Results  Component Value Date   HIV1RNAQUANT Not Detected 11/14/2023   HIV1RNAQUANT Not Detected 07/17/2023   HIV1RNAQUANT Not Detected 05/19/2023   CD4TABS 1,440 03/07/2023   CD4TABS 913 07/12/2021   CD4TABS 1,044 04/14/2021    RPR and STI Lab Results  Component Value Date   LABRPR REACTIVE (A) 11/14/2023   LABRPR REACTIVE (A) 09/20/2023   LABRPR REACTIVE (A) 07/17/2023   LABRPR REACTIVE (A) 03/01/2023   LABRPR Reactive (A) 06/10/2022   RPRTITER 1:4 (H) 11/14/2023   RPRTITER 1:2 (H) 09/20/2023   RPRTITER 1:8 (H) 07/17/2023   RPRTITER 1:2 (H) 03/01/2023   RPRTITER 1:2 (H) 01/07/2022    STI Results GC CT  11/14/2023 10:20 AM Negative    Negative    Negative   Negative    Negative    Negative   09/20/2023  9:46 AM Negative    Negative    Negative  Negative    Negative    Negative   07/17/2023 11:30 AM Negative    Negative  Negative    Negative   07/17/2023 11:29 AM Negative  Negative   03/01/2023  1:53 PM Negative  Negative   03/01/2023  1:51 PM Negative  Negative   03/01/2023  1:42 PM Negative  Negative   06/10/2022  3:14 PM Negative    Negative  Negative    Negative   09/15/2021 11:20 AM Negative  Negative   07/12/2021  3:41 PM Negative    Negative    Negative  Negative    Negative    Negative   04/14/2021  3:52 PM Negative  Negative   01/26/2021  2:32 PM Negative  Negative   04/11/2020 11:04 AM Negative  Negative     Hepatitis B Lab Results  Component Value Date   HEPBSAB REACTIVE (A) 04/14/2021   HEPBSAG NON-REACTIVE 04/14/2021   HEPBCAB NON-REACTIVE 04/14/2021   Hepatitis C Lab Results  Component Value Date   HEPCAB REACTIVE (A) 04/14/2021   HCVRNAPCRQN <15 NOT DETECTED 04/14/2021   Hepatitis A Lab Results  Component Value Date   HAV REACTIVE (A) 04/14/2021   Lipids: Lab Results  Component Value Date  CHOL 193 11/14/2023   TRIG 182 (H) 11/14/2023   HDL 38 (L) 11/14/2023   CHOLHDL 5.1 (H) 11/14/2023   LDLCALC 125 (H) 11/14/2023    TARGET DATE: 27th of the month  Assessment: Justin [name] presents today for his maintenance Cabenuva injections. Originally scheduled with Justin Alberts, NP, for routine HIV follow up. Past injections were tolerated well without issues. Last HIV RNA was undetectable on 11/14/23. Doing well with no issues today. Justin [name] is up to date on immunizations. Will update CD4 count, RPR today. Note transient increase in RPR last visit, with treatment for early latent syphilis in 06/2023. Justin [name] reports new sexual partner since last STI screening, and would like routine screening today. He also endorses some itching with the rilpivirine post-injection occasionally. No signs of allergic reaction or  otherwise.  Administered cabotegravir 600mg /43mL in left upper outer quadrant of the gluteal muscle. Administered rilpivirine 900 mg/92mL in the right upper outer quadrant of the gluteal muscle. No issues with injections. Justin [name] will follow up in 2 months for next set of injections.  Plan: - Cabenuva injections administered - CD4, RPR, oral/rectal/urine screen for gonorrhea/chlamydia - Next injections scheduled for 5/30 with Justin Alberts, NP, then 05/14/24 with Justin [name], PharmD - Call with any issues or questions  Justin [name], PharmD PGY-2 Infectious Diseases Pharmacy Resident [facility] for Infectious Disease

## 2024-01-23 ENCOUNTER — Ambulatory Visit (INDEPENDENT_AMBULATORY_CARE_PROVIDER_SITE_OTHER): Admitting: Pharmacist

## 2024-01-23 ENCOUNTER — Other Ambulatory Visit: Payer: Self-pay

## 2024-01-23 ENCOUNTER — Other Ambulatory Visit (HOSPITAL_COMMUNITY)
Admission: RE | Admit: 2024-01-23 | Discharge: 2024-01-23 | Disposition: A | Discharge status: Home / Self Care | Source: Ambulatory Visit | Attending: Infectious Diseases | Admitting: Infectious Diseases

## 2024-01-23 DIAGNOSIS — B20 Human immunodeficiency virus [HIV] disease: Secondary | ICD-10-CM

## 2024-01-23 DIAGNOSIS — Z113 Encounter for screening for infections with a predominantly sexual mode of transmission: Secondary | ICD-10-CM

## 2024-01-23 MED ORDER — CABOTEGRAVIR & RILPIVIRINE ER 600 & 900 MG/3ML IM SUER
1.0000 | Freq: Once | INTRAMUSCULAR | Status: AC
  Administered 2024-01-23: 1 via INTRAMUSCULAR

## 2024-01-24 LAB — URINE CYTOLOGY ANCILLARY ONLY
Chlamydia: NEGATIVE
Comment: NEGATIVE
Comment: NORMAL
Neisseria Gonorrhea: NEGATIVE

## 2024-01-24 LAB — T-HELPER CELLS (CD4) COUNT (NOT AT ARMC)
CD4 % Helper T Cell: 35 % (ref 33–65)
CD4 T Cell Abs: 827 /uL (ref 400–1790)

## 2024-01-24 LAB — CYTOLOGY, (ORAL, ANAL, URETHRAL) ANCILLARY ONLY
Chlamydia: NEGATIVE
Chlamydia: NEGATIVE
Comment: NEGATIVE
Comment: NEGATIVE
Comment: NORMAL
Comment: NORMAL
Neisseria Gonorrhea: NEGATIVE
Neisseria Gonorrhea: NEGATIVE

## 2024-01-26 LAB — T PALLIDUM AB: T Pallidum Abs: POSITIVE — AB

## 2024-01-26 LAB — RPR: RPR Ser Ql: REACTIVE — AB

## 2024-01-26 LAB — RPR TITER: RPR Titer: 1:2 {titer} — ABNORMAL HIGH

## 2024-03-12 ENCOUNTER — Ambulatory Visit: Payer: BC Managed Care – PPO | Admitting: Pharmacist

## 2024-03-22 ENCOUNTER — Encounter: Admitting: Infectious Diseases

## 2024-03-26 NOTE — Progress Notes (Unsigned)
 HPI: Justin [name] is a 43 y.o. male who presents to the [facility] pharmacy clinic for Cabenu[place]  administration.  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   PRAZOSIN (MINIPRESS) 1 MG CAPSULE    Take 1 mg by mouth at bedtime.   VALACYCLOVIR (VALTREX) 500 MG TABLET    Take 500 mg by mouth daily.   VENLAFAXINE  XR (EFFEXOR -XR) 150 MG 24 HR CAPSULE    TAKE 1 CAPSULE BY MOUTH EVERY DAY  Modified Medications   No medications on file  Discontinued Medications   No medications on file    Allergies: No Known Allergies  Labs: Lab Results  Component Value Date   HIV1RNAQUANT Not Detected 11/14/2023   HIV1RNAQUANT Not Detected 07/17/2023   HIV1RNAQUANT Not Detected 05/19/2023   CD4TABS 827 01/23/2024   CD4TABS 1,440 03/07/2023   CD4TABS 913 07/12/2021    RPR and STI Lab Results  Component Value Date   LABRPR REACTIVE (A) 01/23/2024   LABRPR REACTIVE (A) 11/14/2023   LABRPR REACTIVE (A) 09/20/2023   LABRPR REACTIVE (A) 07/17/2023   LABRPR REACTIVE (A) 03/01/2023   RPRTITER 1:2 (H) 01/23/2024   RPRTITER 1:4 (H) 11/14/2023   RPRTITER 1:2 (H) 09/20/2023   RPRTITER 1:8 (H) 07/17/2023   RPRTITER 1:2 (H) 03/01/2023    STI Results GC CT  01/23/2024  3:10 PM Negative    Negative    Negative   Negative    Negative    Negative   11/14/2023 10:20 AM Negative    Negative    Negative  Negative    Negative    Negative   09/20/2023  9:46 AM Negative    Negative    Negative  Negative    Negative    Negative   07/17/2023 11:30 AM Negative    Negative  Negative    Negative   07/17/2023 11:29 AM Negative  Negative   03/01/2023  1:53 PM Negative  Negative   03/01/2023  1:51 PM Negative  Negative   03/01/2023  1:42 PM Negative  Negative   06/10/2022  3:14 PM Negative    Negative  Negative    Negative   09/15/2021 11:20 AM Negative  Negative   07/12/2021  3:41 PM Negative    Negative    Negative  Negative    Negative    Negative   04/14/2021  3:52 PM Negative  Negative   01/26/2021  2:32 PM Negative  Negative   04/11/2020 11:04 AM Negative  Negative     Hepatitis B Lab Results  Component Value Date   HEPBSAB REACTIVE (A) 04/14/2021   HEPBSAG NON-REACTIVE 04/14/2021   HEPBCAB NON-REACTIVE 04/14/2021   Hepatitis C Lab Results  Component Value Date   HEPCAB REACTIVE (A) 04/14/2021   HCVRNAPCRQN <15 NOT DETECTED 04/14/2021  Hepatitis A Lab Results  Component Value Date   HAV REACTIVE (A) 04/14/2021   Lipids: Lab Results  Component Value Date   CHOL 193 11/14/2023   TRIG 182 (H) 11/14/2023   HDL 38 (L) 11/14/2023   CHOLHDL 5.1 (H) 11/14/2023   LDLCALC 125 (H) 11/14/2023    TARGET DATE: The 27th  Assessment: Justin [name] presents today for his maintenance Cabenuva  injections. Past injections were tolerated well without issues. Last HIV RNA was not detected in January. Doing well with no issues today. He is one day out of his Cabenuva  window. Discussed this with him and reminded to stay within the 14 days around his target date. No problems today. Requesting STI testing so will get updated viral load as well. He is excited about his upcoming trips to [place] in August and [place] in November.  Administered cabotegravir  600mg /23mL in left upper outer quadrant of the  gluteal muscle. Administered rilpivirine  900 mg/3mL in the right upper outer quadrant of the gluteal muscle. No issues with injections. He will follow up in 2 months for next set of injections. He was due to see Trevor Fudge this month but had to reschedule. Will set up his next one with her.  Plan: - Cabenuva  injections administered - HIV RNA, RPR, and urine/rectal/pharyngeal cytologies for GC/chlamydia  - Next injections scheduled for 05/20/24 with Trevor Fudge and 07/16/24 with me - Call with any issues or questions  Audriana Aldama L. Carrie Usery, PharmD, BCIDP, AAHIVP, CPP Clinical Pharmacist Practitioner - Infectious Diseases Clinical Pharmacist Lead - [facility] for Infectious Disease 03/26/2024, 3:33 PM

## 2024-03-27 ENCOUNTER — Other Ambulatory Visit (HOSPITAL_COMMUNITY)
Admission: RE | Admit: 2024-03-27 | Discharge: 2024-03-27 | Disposition: A | Discharge status: Home / Self Care | Source: Ambulatory Visit | Attending: Infectious Diseases | Admitting: Infectious Diseases

## 2024-03-27 ENCOUNTER — Ambulatory Visit: Admitting: Pharmacist

## 2024-03-27 ENCOUNTER — Other Ambulatory Visit: Payer: Self-pay

## 2024-03-27 DIAGNOSIS — B20 Human immunodeficiency virus [HIV] disease: Secondary | ICD-10-CM

## 2024-03-27 DIAGNOSIS — Z113 Encounter for screening for infections with a predominantly sexual mode of transmission: Secondary | ICD-10-CM | POA: Insufficient documentation

## 2024-03-27 MED ORDER — CABOTEGRAVIR & RILPIVIRINE ER 600 & 900 MG/3ML IM SUER
1.0000 | Freq: Once | INTRAMUSCULAR | Status: AC
  Administered 2024-03-27: 1 via INTRAMUSCULAR

## 2024-03-28 ENCOUNTER — Other Ambulatory Visit: Payer: Self-pay | Admitting: Medical Genetics

## 2024-03-28 LAB — CYTOLOGY, (ORAL, ANAL, URETHRAL) ANCILLARY ONLY
Chlamydia: NEGATIVE
Chlamydia: NEGATIVE
Comment: NEGATIVE
Comment: NEGATIVE
Comment: NORMAL
Comment: NORMAL
Neisseria Gonorrhea: NEGATIVE
Neisseria Gonorrhea: NEGATIVE

## 2024-03-28 LAB — URINE CYTOLOGY ANCILLARY ONLY
Chlamydia: NEGATIVE
Comment: NEGATIVE
Comment: NORMAL
Neisseria Gonorrhea: NEGATIVE

## 2024-03-29 LAB — RPR: RPR Ser Ql: REACTIVE — AB

## 2024-03-29 LAB — HIV-1 RNA QUANT-NO REFLEX-BLD
HIV 1 RNA Quant: NOT DETECTED {copies}/mL
HIV-1 RNA Quant, Log: NOT DETECTED {Log_copies}/mL

## 2024-03-29 LAB — RPR TITER: RPR Titer: 1:4 {titer} — ABNORMAL HIGH

## 2024-03-29 LAB — T PALLIDUM AB: T Pallidum Abs: POSITIVE — AB

## 2024-04-01 ENCOUNTER — Other Ambulatory Visit (HOSPITAL_COMMUNITY)

## 2024-05-14 ENCOUNTER — Encounter: Admitting: Pharmacist

## 2024-05-20 ENCOUNTER — Other Ambulatory Visit: Payer: Self-pay

## 2024-05-20 ENCOUNTER — Encounter: Payer: Self-pay | Admitting: Infectious Diseases

## 2024-05-20 ENCOUNTER — Ambulatory Visit (INDEPENDENT_AMBULATORY_CARE_PROVIDER_SITE_OTHER): Admitting: Infectious Diseases

## 2024-05-20 ENCOUNTER — Other Ambulatory Visit (HOSPITAL_COMMUNITY)
Admission: RE | Admit: 2024-05-20 | Discharge: 2024-05-20 | Disposition: A | Discharge status: Home / Self Care | Source: Ambulatory Visit | Attending: Infectious Diseases | Admitting: Infectious Diseases

## 2024-05-20 VITALS — BP 129/82 | HR 98 | Temp 98.7°F | Ht 68.0 in | Wt 183.0 lb

## 2024-05-20 DIAGNOSIS — Z1159 Encounter for screening for other viral diseases: Secondary | ICD-10-CM

## 2024-05-20 DIAGNOSIS — B20 Human immunodeficiency virus [HIV] disease: Secondary | ICD-10-CM

## 2024-05-20 DIAGNOSIS — Z113 Encounter for screening for infections with a predominantly sexual mode of transmission: Secondary | ICD-10-CM | POA: Diagnosis not present

## 2024-05-20 MED ORDER — CABOTEGRAVIR & RILPIVIRINE ER 600 & 900 MG/3ML IM SUER
1.0000 | Freq: Once | INTRAMUSCULAR | Status: AC
  Administered 2024-05-20: 1 via INTRAMUSCULAR

## 2024-05-20 NOTE — Progress Notes (Signed)
 Name: Justin [name]  DOB: 1981/05/10 MRN: [id number] PCP: Ilah Crigler, MD    Brief Narrative:  Justin [name] is a 43 y.o. male with HIV dx 04/11/2007. Started medications in 2009.  In care with [facility] 2016 - 2021.  CD4 nadir > 200 VL < 20  HIV Risk: sexual, msm History of OIs: none Intake Labs 03/2021: Hep B sAg (-), sAb (+), cAb (-); Hep A (+), Hep C (+, RNA - 03/2021) Quantiferon (-) HLA B*5701 (-) G6PD: ()   Previous Regimens: Atripla 2009 - 2016 Triumeq , 2017 - 2022  Cabenuva  05/2021    Genotypes: None on file  Subjective:   Chief Complaint  Patient presents with   Follow-up     Discussed the use of AI scribe software for clinical note transcription with the patient, who gave verbal consent to proceed.  History of Present Illness   Justin [name] is a 43 year old male with HIV who presents for a follow-up visit.  He is managing HIV with Cabenuva , and recent labs show an undetectable viral load. He acknowledges missing some injections in the past. He is proactive about sexual health due to his open marriage.  He is undergoing testosterone therapy with [facility], initially starting with pills and gel, and now on injectables. He reports feeling better since the dosage was increased to 1 milliliter of testosterone cypionate, administered once a week, starting about three weeks ago.  He is on atorvastatin  for cholesterol management and tolerates it well. His last cholesterol test in January showed a total cholesterol of 193 mg/dL, although he was not fasting at the time.  He experiences stress related to financial adjustments due to his husband's work situation and the general cost of living. He is a Financial controller and travels frequently, which is relevant to his health monitoring, particularly for infectious diseases. No significant changes in sleep or mood, though he reports fluctuations due to stress.          05/20/2024    10:54 AM  Depression screen PHQ 2/9  Decreased Interest 1  Down, Depressed, Hopeless 1  PHQ - 2 Score 2  Altered sleeping 2  Tired, decreased energy 1  Change in appetite 1  Feeling bad or failure about yourself  0  Trouble concentrating 0  Moving slowly or fidgety/restless 0  Suicidal thoughts 1   PHQ-9 Score 7  Difficult doing work/chores Somewhat difficult     Significant value     Review of Systems  Constitutional:  Negative for chills, fever, malaise/fatigue and weight loss.  HENT:  Negative for sore throat.        No dental problems  Respiratory:  Negative for cough and sputum production.   Cardiovascular:  Negative for chest pain and leg swelling.  Gastrointestinal:  Negative for abdominal pain, diarrhea and vomiting.  Genitourinary:  Negative for dysuria and flank pain.  Musculoskeletal:  Negative for joint pain, myalgias and neck pain.  Skin:  Negative for rash.  Neurological:  Negative for dizziness, tingling and headaches.  Psychiatric/Behavioral:  Negative for depression and substance abuse. The patient is not nervous/anxious and does not have insomnia.   All other systems reviewed and are negative.    Past Medical History:  Diagnosis Date   Colitis    HIV (human immunodeficiency virus infection) (HCC)    HIV (human immunodeficiency virus infection) (HCC)    Hyperlipidemia    Preproliferative diabetic retinopathy (HCC)     Outpatient Medications  Prior to Visit  Medication Sig Dispense Refill   atorvastatin  (LIPITOR) 10 MG tablet Take 1 tablet (10 mg total) by mouth daily. 30 tablet 5   doxycycline  (ADOXA) 100 MG tablet Take 100 mg by mouth as needed (doxy PEP).     Glucosamine HCl (GLUCOSAMINE PO) Take by mouth.     Multiple Vitamin (MULTIVITAMIN PO) Take by mouth.     prazosin (MINIPRESS) 1 MG capsule Take 1 mg by mouth at bedtime.     valACYclovir (VALTREX) 500 MG tablet Take 500 mg by mouth daily.     venlafaxine  XR (EFFEXOR -XR) 150 MG 24 hr capsule  TAKE 1 CAPSULE BY MOUTH EVERY DAY 90 capsule 1   No facility-administered medications prior to visit.     No Known Allergies  Social History   Tobacco Use   Smoking status: Every Day    Types: E-cigarettes   Smokeless tobacco: Never  Vaping Use   Vaping status: Every Day   Substances: Nicotine  Substance Use Topics   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     Social History   Substance and Sexual Activity  Sexual Activity Yes   Partners: Male   Birth control/protection: None     Objective:   Vitals:   05/20/24 1048  BP: 129/82  Pulse: 98  Temp: 98.7 F (37.1 C)  TempSrc: Temporal  SpO2: 97%  Weight: 183 lb (83 kg)  Height: 5' 8 (1.727 m)   Body mass index is 27.83 kg/m.  Physical Exam Vitals reviewed.  Constitutional:      Appearance: Normal appearance. He is not ill-appearing.  HENT:     Head: Normocephalic.     Mouth/Throat:     Mouth: Mucous membranes are moist.     Pharynx: Oropharynx is clear.  Eyes:     General: No scleral icterus. Cardiovascular:     Rate and Rhythm: Normal rate and regular rhythm.  Pulmonary:     Effort: Pulmonary effort is normal.  Musculoskeletal:        General: Normal range of motion.     Cervical back: Normal range of motion.  Skin:    Coloration: Skin is not jaundiced or pale.  Neurological:     Mental Status: He is alert and oriented to person, place, and time.  Psychiatric:        Mood and Affect: Mood normal.        Judgment: Judgment normal.     Lab Results Lab Results  Component Value Date   WBC 8.6 03/07/2023   HGB 13.4 03/07/2023   HCT 43.8 03/07/2023   MCV 75.8 (L) 03/07/2023   PLT 251 03/07/2023    Lab Results  Component Value Date   CREATININE 0.97 11/14/2023   BUN 12 11/14/2023   NA 142 11/14/2023   K 3.7 11/14/2023   CL 102 11/14/2023   CO2 31 11/14/2023    Lab Results  Component  Value Date   ALT 19 11/14/2023   AST 15 11/14/2023   BILITOT 0.4 11/14/2023    Lab Results  Component Value Date   CHOL 193 11/14/2023   HDL 38 (L) 11/14/2023   LDLCALC 125 (H) 11/14/2023   TRIG 182 (H) 11/14/2023   CHOLHDL 5.1 (H) 11/14/2023   HIV 1 RNA Quant  Date Value  03/27/2024 NOT DETECTED copies/mL  11/14/2023 Not Detected Copies/mL  07/17/2023 Not Detected Copies/mL   CD4 T Cell Abs (/uL)  Date Value  01/23/2024 827  03/07/2023 1,440  07/12/2021 913     Assessment & Plan:  Assessment and Plan    HIV infection - HIV infection is well-managed with Cabenuva , with previous labs showing undetectable viral load. Will check MMR titers given robust travel occupation to ensure he is immune. Sexual health screenings collected. Continue statin for primary prevention.  - Emphasize adherence to the injection schedule to maintain viral suppression. - Perform urine, rectal, and oral sexual health screenings due to open relationship with husband. - Order syphilis titer.  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 - Order measles titer to assess immunity. - Consider MMR booster if titer indicates susceptibility. - Anal cancer screening due next OV      Meds ordered this encounter  Medications   cabotegravir  & rilpivirine  ER (CABENUVA ) 600 & 900 MG/3ML injection 1 kit   Orders Placed This Encounter  Procedures   RPR   Measles/Mumps/Rubella Immunity   HIV 1 RNA quant-no reflex-bld   T-helper cells (CD4) count   07/16/2024 TD 27th Odd Months   Corean Fireman, MSN, NP-C [facility] for Infectious Disease [facility] Pager: [phone] Office: [phone]   05/20/24  10:55 AM

## 2024-05-20 NOTE — Patient Instructions (Addendum)
 Please continue the cabenuva  injections as you have been   Will see if you need a booster vaccine for measles with blood work today.   Next visit with us  : 07/16/2024 in our pharmacy clinic   Smoking Cessation: Quitline[place] 1-800-QUIT-NOW 680-157-2786); Espaol: 1-855-Djelo-Ya (8-144-664-6430) [email or web address]    Mental Health Resources  988: can call or text 24/7  [facility]: Address: [address] [place], [place] [address] Open 24 hours Phone: [phone]  [facility] [place]: Address: [address] [place], [place] [address] Phone: [phone] Appointments: [email or web address]

## 2024-05-21 ENCOUNTER — Ambulatory Visit: Payer: Self-pay | Admitting: Infectious Diseases

## 2024-05-21 LAB — CYTOLOGY, (ORAL, ANAL, URETHRAL) ANCILLARY ONLY
Chlamydia: NEGATIVE
Chlamydia: NEGATIVE
Comment: NEGATIVE
Comment: NEGATIVE
Comment: NORMAL
Comment: NORMAL
Neisseria Gonorrhea: NEGATIVE
Neisseria Gonorrhea: NEGATIVE

## 2024-05-21 LAB — T-HELPER CELLS (CD4) COUNT (NOT AT ARMC)
CD4 % Helper T Cell: 30 % — ABNORMAL LOW (ref 33–65)
CD4 T Cell Abs: 931 /uL (ref 400–1790)

## 2024-05-21 LAB — URINE CYTOLOGY ANCILLARY ONLY
Chlamydia: NEGATIVE
Comment: NEGATIVE
Comment: NORMAL
Neisseria Gonorrhea: NEGATIVE

## 2024-05-23 LAB — RPR: RPR Ser Ql: REACTIVE — AB

## 2024-05-23 LAB — T PALLIDUM AB: T Pallidum Abs: POSITIVE — AB

## 2024-05-23 LAB — MEASLES/MUMPS/RUBELLA IMMUNITY
Mumps IgG: 188 [AU]/ml
Rubella: 3 {index}
Rubeola IgG: 174 [AU]/ml

## 2024-05-23 LAB — HIV-1 RNA QUANT-NO REFLEX-BLD
HIV 1 RNA Quant: NOT DETECTED {copies}/mL
HIV-1 RNA Quant, Log: NOT DETECTED {Log_copies}/mL

## 2024-05-23 LAB — RPR TITER: RPR Titer: 1:4 {titer} — ABNORMAL HIGH

## 2024-07-16 ENCOUNTER — Encounter: Payer: Self-pay | Admitting: Infectious Diseases

## 2024-07-16 ENCOUNTER — Ambulatory Visit (INDEPENDENT_AMBULATORY_CARE_PROVIDER_SITE_OTHER): Admitting: Infectious Diseases

## 2024-07-16 ENCOUNTER — Ambulatory Visit: Payer: Self-pay | Admitting: Pharmacist

## 2024-07-16 ENCOUNTER — Other Ambulatory Visit: Payer: Self-pay

## 2024-07-16 VITALS — BP 153/89 | HR 83 | Temp 98.2°F | Wt 182.0 lb

## 2024-07-16 DIAGNOSIS — Z1212 Encounter for screening for malignant neoplasm of rectum: Secondary | ICD-10-CM | POA: Diagnosis not present

## 2024-07-16 DIAGNOSIS — Z23 Encounter for immunization: Secondary | ICD-10-CM

## 2024-07-16 DIAGNOSIS — B20 Human immunodeficiency virus [HIV] disease: Secondary | ICD-10-CM

## 2024-07-16 DIAGNOSIS — Z113 Encounter for screening for infections with a predominantly sexual mode of transmission: Secondary | ICD-10-CM

## 2024-07-16 MED ORDER — CABOTEGRAVIR & RILPIVIRINE ER 600 & 900 MG/3ML IM SUER
1.0000 | Freq: Once | INTRAMUSCULAR | Status: AC
  Administered 2024-07-16: 1 via INTRAMUSCULAR

## 2024-07-16 NOTE — Patient Instructions (Addendum)
 Will try to send these swabs to [facility] to see if that helps with the bills. This is not new however so I am not sure what has changed with the [employer]...   I have put orders in for [facility] as well - there is a [facility] across the street at our [place] vascular building (the pretty parking deck). I don't think you need an appointment and they should be able to see your lab orders in the computer. We can try that process for you to see if that helps   Next injection: 09/16/2024   Mental Health Resources  988: can call or text 24/7   [facility]: Address: [address], [place], [place] [address] Open 24 hours Phone: [phone]  [facility] [place]: Address: [address], [place], [place] [address] Phone: [phone] Appointments: [email or web address]  Smoking Cessation: Quitline[place] 1-800-QUIT-NOW 440-478-5739); Espaol: 1-855-Djelo-Ya (1-713-123-6340) [email or web address]

## 2024-07-16 NOTE — Progress Notes (Signed)
 Name: Justin [name]  DOB: 1981/03/19 MRN: [id number] PCP: Ilah Crigler, MD    Brief Narrative:  Justin [name] is a 43 y.o. male with HIV dx 04/11/2007. Started medications in 2009.  In care with [facility] 2016 - 2021.  CD4 nadir > 200 VL < 20  HIV Risk: sexual, msm History of OIs: none Intake Labs 03/2021: Hep B sAg (-), sAb (+), cAb (-); Hep A (+), Hep C (+, RNA - 03/2021) Quantiferon (-) HLA B*5701 (-) G6PD: ()   Previous Regimens: Atripla 2009 - 2016 Triumeq , 2017 - 2022  Cabenuva  05/2021    Genotypes: None on file  Subjective:   Chief Complaint  Patient presents with   Follow-up     Discussed the use of AI scribe software for clinical note transcription with the patient, who gave verbal consent to proceed.  History of Present Illness   Justin [name] is a 43 year old male with HIV who presents for routine follow-up.  He is on an antiretroviral regimen that excludes tenofovir, and his last viral load in July was undetectable. He has been attempting to quit smoking for an extended period.  He has experienced allergies since 2020, which began when he was running outside due to gym closures. Symptoms include coughing, a sore throat, and a sensation of feeling 'drugged down.' He has not experienced eczema since his 3s, although he had transient episodes in his late 71s.  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 He acknowledges that his drinking was a symptom of deeper issues related to his thinking. 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 He expresses concern about his marriage due to a recent incident where he did not communicate with his husband before engaging in activities outside his relationship, leading to tension.  He mentions financial stress due to his  husband's work being reduced to three days a week, impacting his income. He also discusses the financial burden of lab bills.         07/16/2024    9:14 AM  Depression screen PHQ 2/9  Decreased Interest 2  Down, Depressed, Hopeless 2  PHQ - 2 Score 4  Altered sleeping 3  Tired, decreased energy 1  Change in appetite 1  Feeling bad or failure about yourself  2  Trouble concentrating 1  Moving slowly or fidgety/restless 1  Suicidal thoughts 1  PHQ-9 Score 14     Review of Systems  Constitutional:  Negative for chills, fever, malaise/fatigue and weight loss.  HENT:  Negative for sore throat.        No dental problems  Respiratory:  Negative for cough and sputum production.   Cardiovascular:  Negative for chest pain and leg swelling.  Gastrointestinal:  Negative for abdominal pain, diarrhea and vomiting.  Genitourinary:  Negative for dysuria and flank pain.  Musculoskeletal:  Negative for joint pain, myalgias and neck pain.  Skin:  Negative for rash.  Neurological:  Negative for dizziness, tingling and headaches.  Psychiatric/Behavioral:  Negative for depression and substance abuse. The patient is not nervous/anxious and does not have insomnia.   All other systems reviewed and are negative.    Past Medical History:  Diagnosis Date   Colitis    HIV (human immunodeficiency virus infection) (HCC)    HIV (human immunodeficiency virus infection) (HCC)  Hyperlipidemia    Preproliferative diabetic retinopathy (HCC)     Outpatient Medications Prior to Visit  Medication Sig Dispense Refill   atorvastatin  (LIPITOR) 10 MG tablet Take 1 tablet (10 mg total) by mouth daily. 30 tablet 5   cabotegravir  & rilpivirine  ER (CABENUVA ) 600 & 900 MG/3ML injection Inject 1 kit into the muscle every 2 (two) months.     doxycycline  (ADOXA) 100 MG tablet Take 100 mg by mouth as needed (doxy PEP).     Glucosamine HCl (GLUCOSAMINE PO) Take by mouth.     Multiple Vitamin (MULTIVITAMIN PO) Take by  mouth.     testosterone cypionate (DEPOTESTOSTERONE CYPIONATE) 200 MG/ML injection Inject 200 mg into the muscle once a week.     valACYclovir (VALTREX) 500 MG tablet Take 500 mg by mouth daily.     venlafaxine  XR (EFFEXOR -XR) 150 MG 24 hr capsule TAKE 1 CAPSULE BY MOUTH EVERY DAY 90 capsule 1   prazosin (MINIPRESS) 1 MG capsule Take 1 mg by mouth at bedtime.     No facility-administered medications prior to visit.     No Known Allergies  Social History   Tobacco Use   Smoking status: Every Day    Types: E-cigarettes   Smokeless tobacco: Never  Vaping Use   Vaping status: Every Day   Substances: Nicotine  Substance Use Topics   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     Social History   Substance and Sexual Activity  Sexual Activity Yes   Partners: Male   Birth control/protection: None     Objective:   Vitals:   07/16/24 0913  BP: (!) 153/89  Pulse: 83  Temp: 98.2 F (36.8 C)  TempSrc: Oral  SpO2: 99%  Weight: 182 lb (82.6 kg)   Body mass index is 27.67 kg/m.  Physical Exam Vitals reviewed.  Constitutional:      Appearance: Normal appearance. He is not ill-appearing.  HENT:     Head: Normocephalic.     Mouth/Throat:     Mouth: Mucous membranes are moist.     Pharynx: Oropharynx is clear.  Eyes:     General: No scleral icterus. Cardiovascular:     Rate and Rhythm: Normal rate and regular rhythm.  Pulmonary:     Effort: Pulmonary effort is normal.  Musculoskeletal:        General: Normal range of motion.     Cervical back: Normal range of motion.  Skin:    Coloration: Skin is not jaundiced or pale.  Neurological:     Mental Status: He is alert and oriented to person, place, and time.  Psychiatric:        Mood and Affect: Mood normal.        Judgment: Judgment normal.     Lab Results Lab Results  Component Value Date   WBC 8.6 03/07/2023    HGB 13.4 03/07/2023   HCT 43.8 03/07/2023   MCV 75.8 (L) 03/07/2023   PLT 251 03/07/2023    Lab Results  Component Value Date   CREATININE 0.97 11/14/2023   BUN 12 11/14/2023   NA 142 11/14/2023   K 3.7 11/14/2023   CL 102 11/14/2023   CO2 31 11/14/2023    Lab Results  Component Value Date   ALT 19 11/14/2023   AST 15 11/14/2023   BILITOT 0.4 11/14/2023    Lab Results  Component Value Date   CHOL 193 11/14/2023   HDL 38 (L) 11/14/2023   LDLCALC 125 (H) 11/14/2023   TRIG 182 (H) 11/14/2023   CHOLHDL 5.1 (H) 11/14/2023   HIV 1 RNA Quant  Date Value  05/20/2024 NOT DETECTED copies/mL  03/27/2024 NOT DETECTED copies/mL  11/14/2023 Not Detected Copies/mL   CD4 T Cell Abs (/uL)  Date Value  05/20/2024 931  01/23/2024 827  03/07/2023 1,440     Assessment & Plan:     HIV infection - HIV infection is well-controlled with a regimen that avoids tenofovir due to specific concerns. The viral load in July was reported as undetectable, indicating effective control of the infection. - Continue current HIV medication regimen avoiding tenofovir - Order blood work through [facility] for routine monitoring  Anal cancer screening - Anal cancer screening is recommended due to increased risk associated with HIV and HPV. Screening involves a non-invasive Q-tip test to check for HPV presence and potential cancerous changes in rectal tissue. Screening is advised annually starting at age 38. - Perform self-collected anal cancer screening using a Q-tip - Recommend annual screening for anal cancer  Screening for sexually transmitted infections - Routine screening for sexually transmitted infections is indicated, including testing for gonorrhea and chlamydia through urine, throat, and rectal swabs. - Perform gonorrhea and chlamydia swabs for urine, throat, and rectal sites  Influenza immunization - Influenza immunization is recommended as part of routine preventive care. - Administer  influenza vaccine  Recording duration: 25 minutes      Meds ordered this encounter  Medications   cabotegravir  & rilpivirine  ER (CABENUVA ) 600 & 900 MG/3ML injection 1 kit   Orders Placed This Encounter  Procedures   C. trachomatis/N. gonorrhoeae RNA   CT/NG RNA, TMA Rectal   GC/CT Probe, Amp (Throat)   Flu vaccine trivalent PF, 6mos and older(Flulaval,Afluria,Fluarix,Fluzone)   HIV 1 RNA quant-no reflex-bld    Standing Status:   Future    Expiration Date:   07/16/2025   T-helper cells (CD4) count    Standing Status:   Future    Expiration Date:   07/16/2025   RPR    Standing Status:   Future    Expiration Date:   07/16/2025   09/16/2024 TD 27th Odd Months   Corean Fireman, MSN, NP-C [facility] for Infectious Disease [facility] Pager: [phone] Office: [phone]   07/16/24  10:48 AM

## 2024-07-17 LAB — GC/CHLAMYDIA PROBE, AMP (THROAT)
Chlamydia trachomatis RNA: NOT DETECTED
Neisseria gonorrhoeae RNA: NOT DETECTED

## 2024-07-17 LAB — CT/NG RNA, TMA RECTAL
Chlamydia Trachomatis RNA: NOT DETECTED
Neisseria Gonorrhoeae RNA: NOT DETECTED

## 2024-07-17 LAB — C. TRACHOMATIS/N. GONORRHOEAE RNA
C. trachomatis RNA, TMA: NOT DETECTED
N. gonorrhoeae RNA, TMA: NOT DETECTED

## 2024-07-18 ENCOUNTER — Ambulatory Visit: Payer: Self-pay | Admitting: Infectious Diseases

## 2024-07-22 LAB — CYTOLOGY - NON PAP

## 2024-08-02 ENCOUNTER — Other Ambulatory Visit: Payer: Self-pay | Admitting: Medical Genetics

## 2024-08-02 DIAGNOSIS — Z006 Encounter for examination for normal comparison and control in clinical research program: Secondary | ICD-10-CM

## 2024-09-16 ENCOUNTER — Ambulatory Visit: Admitting: Pharmacist

## 2024-09-17 NOTE — Progress Notes (Unsigned)
 HPI: Justin [name] is a 43 y.o. male who presents to the [facility] pharmacy clinic for Cabenuva  administration.  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   CABOTEGRAVIR  & RILPIVIRINE  ER (CABENUVA ) 600 & 900 MG/3ML INJECTION    Inject 1 kit into the muscle every 2 (two) months.   DOXYCYCLINE  (ADOXA) 100 MG TABLET    Take 100 mg by mouth as needed (doxy PEP).   GLUCOSAMINE HCL (GLUCOSAMINE PO)    Take by mouth.   MULTIPLE VITAMIN (MULTIVITAMIN PO)    Take by mouth.   PRAZOSIN (MINIPRESS) 1 MG CAPSULE    Take 1 mg by mouth at bedtime.   TESTOSTERONE CYPIONATE (DEPOTESTOSTERONE CYPIONATE) 200 MG/ML INJECTION    Inject 200 mg into the muscle once a week.   VALACYCLOVIR (VALTREX) 500 MG TABLET    Take 500 mg by mouth daily.   VENLAFAXINE  XR (EFFEXOR -XR) 150 MG 24 HR CAPSULE    TAKE 1 CAPSULE BY MOUTH EVERY DAY  Modified Medications   No medications on file  Discontinued Medications   No medications on file    Allergies: No Known Allergies  Labs: Lab Results  Component Value Date   HIV1RNAQUANT NOT DETECTED 05/20/2024   HIV1RNAQUANT NOT DETECTED 03/27/2024   HIV1RNAQUANT Not Detected 11/14/2023   CD4TABS 931 05/20/2024   CD4TABS 827 01/23/2024    CD4TABS 1,440 03/07/2023    RPR and STI Lab Results  Component Value Date   LABRPR REACTIVE (A) 05/20/2024   LABRPR REACTIVE (A) 03/27/2024   LABRPR REACTIVE (A) 01/23/2024   LABRPR REACTIVE (A) 11/14/2023   LABRPR REACTIVE (A) 09/20/2023   RPRTITER 1:4 (H) 05/20/2024   RPRTITER 1:4 (H) 03/27/2024   RPRTITER 1:2 (H) 01/23/2024   RPRTITER 1:4 (H) 11/14/2023   RPRTITER 1:2 (H) 09/20/2023    STI Results GC CT  05/20/2024 11:17 AM Negative    Negative  Negative    Negative   05/20/2024 11:14 AM Negative  Negative   03/27/2024  9:56 AM Negative    Negative    Negative  Negative    Negative    Negative   01/23/2024  3:10 PM Negative    Negative    Negative  Negative    Negative    Negative   11/14/2023 10:20 AM Negative    Negative    Negative  Negative    Negative    Negative   09/20/2023  9:46 AM Negative    Negative    Negative  Negative    Negative    Negative   07/17/2023 11:30 AM Negative    Negative  Negative    Negative   07/17/2023 11:29 AM Negative  Negative   03/01/2023  1:53 PM Negative  Negative  03/01/2023  1:51 PM Negative  Negative   03/01/2023  1:42 PM Negative  Negative   06/10/2022  3:14 PM Negative    Negative  Negative    Negative   09/15/2021 11:20 AM Negative  Negative   07/12/2021  3:41 PM Negative    Negative    Negative  Negative    Negative    Negative   04/14/2021  3:52 PM Negative  Negative   01/26/2021  2:32 PM Negative  Negative   04/11/2020 11:04 AM Negative  Negative     Hepatitis B Lab Results  Component Value Date   HEPBSAB REACTIVE (A) 04/14/2021   HEPBSAG NON-REACTIVE 04/14/2021   HEPBCAB NON-REACTIVE 04/14/2021   Hepatitis C Lab Results  Component Value Date   HEPCAB REACTIVE (A) 04/14/2021   HCVRNAPCRQN <15 NOT DETECTED 04/14/2021   Hepatitis A Lab Results  Component Value Date   HAV REACTIVE (A) 04/14/2021   Lipids: Lab Results  Component Value Date   CHOL 193 11/14/2023   TRIG 182 (H) 11/14/2023    HDL 38 (L) 11/14/2023   CHOLHDL 5.1 (H) 11/14/2023   LDLCALC 125 (H) 11/14/2023    Target Date: 27th   Assessment: Justin [name] presents today for his maintenance Cabenuva  injections. Past injections were tolerated well without issues. Last HIV RNA was undetectable on 05/19/2024. Doing well with no issues today.  Lab work:  ***- Oral/urine/rectal cytologies for GC/Chlamydia, RPR *** (last time sent via [facility] to try and help with costs?)  Eligible vaccinations: Covid, Shingrix 1/2 (prescription can be sent to be administered at a pharmacy)  ***  Cabenuva : Administered cabotegravir  600mg /79mL in left upper outer quadrant of the gluteal muscle. Administered rilpivirine  900 mg/3mL in the right upper outer quadrant of the gluteal muscle. No issues with injections. Justin [name] will follow up in 2 months for next set of injections.  Plan: - Cabenuva  injections administered - Next injections scheduled for ***1/20-2/3, 3/20-4/3 - Call with any issues or questions  Justin [name] Close, PharmD PGY2 Infectious Diseases Pharmacy Resident

## 2024-09-18 ENCOUNTER — Other Ambulatory Visit: Payer: Self-pay

## 2024-09-18 ENCOUNTER — Ambulatory Visit: Admitting: Pharmacist

## 2024-09-18 DIAGNOSIS — B20 Human immunodeficiency virus [HIV] disease: Secondary | ICD-10-CM

## 2024-09-18 DIAGNOSIS — Z23 Encounter for immunization: Secondary | ICD-10-CM | POA: Diagnosis not present

## 2024-09-18 DIAGNOSIS — Z113 Encounter for screening for infections with a predominantly sexual mode of transmission: Secondary | ICD-10-CM

## 2024-09-18 MED ORDER — ZOSTER VAC RECOMB ADJUVANTED 50 MCG/0.5ML IM SUSR: INTRAMUSCULAR | 0 refills | Status: DC

## 2024-09-18 MED ORDER — ZOSTER VAC RECOMB ADJUVANTED 50 MCG/0.5ML IM SUSR: INTRAMUSCULAR | 1 refills | Status: AC

## 2024-09-18 MED ORDER — CABOTEGRAVIR & RILPIVIRINE ER 600 & 900 MG/3ML IM SUER
1.0000 | Freq: Once | INTRAMUSCULAR | Status: AC
  Administered 2024-09-18: 1 via INTRAMUSCULAR

## 2024-09-19 LAB — GC/CHLAMYDIA PROBE, AMP (THROAT)
Chlamydia trachomatis RNA: NOT DETECTED
Neisseria gonorrhoeae RNA: NOT DETECTED

## 2024-09-19 LAB — C. TRACHOMATIS/N. GONORRHOEAE RNA
C. trachomatis RNA, TMA: NOT DETECTED
N. gonorrhoeae RNA, TMA: NOT DETECTED

## 2024-09-19 LAB — CT/NG RNA, TMA RECTAL
Chlamydia Trachomatis RNA: NOT DETECTED
Neisseria Gonorrhoeae RNA: NOT DETECTED

## 2024-09-21 LAB — RPR TITER: RPR Titer: 1:4 {titer} — ABNORMAL HIGH

## 2024-09-21 LAB — SYPHILIS: RPR W/REFLEX TO RPR TITER AND TREPONEMAL ANTIBODIES, TRADITIONAL SCREENING AND DIAGNOSIS ALGORITHM: RPR Ser Ql: REACTIVE — AB

## 2024-09-21 LAB — T PALLIDUM AB: T Pallidum Abs: POSITIVE — AB

## 2024-10-22 NOTE — Progress Notes (Signed)
 10-YEAR ASCVD RISK: 4.9%  Inputs Sex: Male Race: African American Age: 43 Total Cholesterol (mg/dL) 806 HDL Cholesterol (mg/dL) 38 LDL Cholesterol (mg/dL) 874 Systolic Blood Pressure (mm Hg) 153 Diastolic Blood Pressure (mm Hg) 89 Diabetes: No Smoker: Never Treatment for Hypertension: No Aspirin Therapy: No Statin: Yes  Stewart Sasaki, BSN, RN

## 2024-11-12 ENCOUNTER — Other Ambulatory Visit (HOSPITAL_COMMUNITY): Payer: Self-pay

## 2024-11-12 ENCOUNTER — Telehealth: Payer: Self-pay

## 2024-11-12 NOTE — Telephone Encounter (Signed)
 Received notification from [facility] regarding a prior authorization for Cabenuva . Authorization has been APPROVED from 11/12/24 to 11/12/25.    Patient must fill through [facility]   Authorization # [id number]   (Patient will need to enroll in [employer] MBR need to call [phone])

## 2024-11-13 ENCOUNTER — Other Ambulatory Visit (HOSPITAL_COMMUNITY): Payer: Self-pay

## 2024-11-14 ENCOUNTER — Other Ambulatory Visit (HOSPITAL_COMMUNITY): Payer: Self-pay

## 2024-11-14 ENCOUNTER — Other Ambulatory Visit: Payer: Self-pay | Admitting: Pharmacist

## 2024-11-14 DIAGNOSIS — B20 Human immunodeficiency virus [HIV] disease: Secondary | ICD-10-CM

## 2024-11-14 MED ORDER — CABOTEGRAVIR & RILPIVIRINE ER 600 & 900 MG/3ML IM SUER: 1.0000 | INTRAMUSCULAR | 5 refills | Status: AC

## 2024-11-15 NOTE — Progress Notes (Unsigned)
 "  HPI: Justin [name] is a 44 y.o. male who presents to the [facility] pharmacy clinic for Cabenuva  administration.  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   CABOTEGRAVIR  & RILPIVIRINE  ER (CABENUVA ) 600 & 900 MG/3ML INJECTION    Inject 1 kit into the muscle every 2 (two) months.   DOXYCYCLINE  (ADOXA) 100 MG TABLET    Take 100 mg by mouth as needed (doxy PEP).   GLUCOSAMINE HCL (GLUCOSAMINE PO)    Take by mouth.   MULTIPLE VITAMIN (MULTIVITAMIN PO)    Take by mouth.   PRAZOSIN (MINIPRESS) 1 MG CAPSULE    Take 1 mg by mouth at bedtime.   TESTOSTERONE CYPIONATE (DEPOTESTOSTERONE CYPIONATE) 200 MG/ML INJECTION    Inject 200 mg into the muscle once a week.   VALACYCLOVIR (VALTREX) 500 MG TABLET    Take 500 mg by mouth daily.   VENLAFAXINE  XR (EFFEXOR -XR) 150 MG 24 HR CAPSULE    TAKE 1 CAPSULE BY MOUTH EVERY DAY   ZOSTER VACCINE ADJUVANTED (SHINGRIX) INJECTION    Inject 0.5 mL into the deltoid once, then administer second dose 2-6 months later.  Modified Medications   No medications on file  Discontinued Medications   No medications on file    Allergies: Allergies[1]  Past Medical History: Past Medical History:  Diagnosis Date   Colitis    HIV  (human immunodeficiency virus infection) (HCC)    HIV (human immunodeficiency virus infection) (HCC)    Hyperlipidemia    Preproliferative diabetic retinopathy (HCC)     Social History: Social History   Socioeconomic History   Marital status: Married    Spouse name: Not on file   Number of children: 0   Years of education: Not on file   Highest education level: Not on file  Occupational History   Not on file  Tobacco Use   Smoking status: Every Day    Types: E-cigarettes   Smokeless tobacco: Never  Vaping Use   Vaping status: Every Day   Substances: Nicotine  Substance and Sexual Activity   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   Sexual activity: Yes    Partners: Male    Birth control/protection: None  Other Topics Concern   Not on file  Social History Narrative   Not on file   Social Drivers of Health   Tobacco Use: Low Risk  (10/10/2024)   Received from [facility]   Patient History    Smoking Tobacco Use: Never    Smokeless Tobacco Use: Never    Passive Exposure: Not on file  Recent Concern: Tobacco Use - High Risk (07/16/2024)  Patient History    Smoking Tobacco Use: Every Day    Smokeless Tobacco Use: Never    Passive Exposure: Not on file  Financial Resource Strain: Not on file  Food Insecurity: Not on file  Transportation Needs: Not on file  Physical Activity: Not on file  Stress: Not on file (08/28/2023)  Social Connections: Not on file  Depression (PHQ2-9): High Risk (07/16/2024)   Depression (PHQ2-9)    PHQ-2 Score: 14  Alcohol Screen: Not on file  Housing: Not on file  Utilities: Not on file  Health Literacy: Not on file    Labs: Lab Results  Component Value Date   HIV1RNAQUANT NOT DETECTED 05/20/2024   HIV1RNAQUANT NOT DETECTED 03/27/2024   HIV1RNAQUANT Not Detected 11/14/2023   CD4TABS 931 05/20/2024   CD4TABS 827 01/23/2024   CD4TABS 1,440  03/07/2023    RPR and STI Lab Results  Component Value Date   LABRPR REACTIVE (A) 09/18/2024   LABRPR REACTIVE (A) 05/20/2024   LABRPR REACTIVE (A) 03/27/2024   LABRPR REACTIVE (A) 01/23/2024   LABRPR REACTIVE (A) 11/14/2023   RPRTITER 1:4 (H) 09/18/2024   RPRTITER 1:4 (H) 05/20/2024   RPRTITER 1:4 (H) 03/27/2024   RPRTITER 1:2 (H) 01/23/2024   RPRTITER 1:4 (H) 11/14/2023    STI Results GC CT  05/20/2024 11:17 AM Negative    Negative  Negative    Negative   05/20/2024 11:14 AM Negative  Negative   03/27/2024  9:56 AM Negative    Negative    Negative  Negative    Negative    Negative   01/23/2024  3:10 PM Negative    Negative    Negative  Negative    Negative    Negative   11/14/2023 10:20 AM Negative    Negative    Negative  Negative    Negative    Negative   09/20/2023  9:46 AM Negative    Negative    Negative  Negative    Negative    Negative   07/17/2023 11:30 AM Negative    Negative  Negative    Negative   07/17/2023 11:29 AM Negative  Negative   03/01/2023  1:53 PM Negative  Negative   03/01/2023  1:51 PM Negative  Negative   03/01/2023  1:42 PM Negative  Negative   06/10/2022  3:14 PM Negative    Negative  Negative    Negative   09/15/2021 11:20 AM Negative  Negative   07/12/2021  3:41 PM Negative    Negative    Negative  Negative    Negative    Negative   04/14/2021  3:52 PM Negative  Negative   01/26/2021  2:32 PM Negative  Negative   04/11/2020 11:04 AM Negative  Negative     Hepatitis B Lab Results  Component Value Date   HEPBSAB REACTIVE (A) 04/14/2021   HEPBSAG NON-REACTIVE 04/14/2021   HEPBCAB NON-REACTIVE 04/14/2021   Hepatitis C Lab Results  Component Value Date   HEPCAB REACTIVE (A) 04/14/2021   HCVRNAPCRQN <15 NOT DETECTED 04/14/2021   Hepatitis A Lab Results  Component Value Date   HAV REACTIVE (A) 04/14/2021   Lipids: Lab Results  Component Value Date   CHOL 193 11/14/2023   TRIG 182 (H) 11/14/2023   HDL 38 (L)  11/14/2023   CHOLHDL 5.1 (H) 11/14/2023   LDLCALC 125 (H) 11/14/2023    TARGET DATE:  The 27th of the month  Assessment: [name] presents today for their maintenance Cabenuva  injections. Initial/past  injections were tolerated well without issues. No problems with systemic effects of injections.   Administered cabotegravir  600mg /67mL in left upper outer quadrant of the gluteal muscle. Administered rilpivirine  900 mg/3mL in the right upper outer quadrant of the gluteal muscle. Monitored patient for 10 minutes after injection. Injections were tolerated well without issue. Patient will follow up in 2 months for next injection. Will check HIV RNA today along with CMP, lipid panel, and CBC today; *** ???? [facility].  Plan: - Administer Cabenuva  injections  - Check HIV RNA, CMP, CBC, and lipid panel  - Next injections scheduled for *** - Call with any issues or questions  Alan Geralds, PharmD, CPP, BCIDP, AAHIVP Clinical Pharmacist Practitioner Infectious Diseases Clinical Pharmacist [facility]      [1] No Known Allergies  "

## 2024-11-18 ENCOUNTER — Encounter: Payer: Self-pay | Admitting: Pharmacist

## 2024-11-19 ENCOUNTER — Other Ambulatory Visit: Payer: Self-pay

## 2024-11-19 ENCOUNTER — Ambulatory Visit (INDEPENDENT_AMBULATORY_CARE_PROVIDER_SITE_OTHER): Admitting: Pharmacist

## 2024-11-19 ENCOUNTER — Ambulatory Visit: Admitting: Pharmacist

## 2024-11-19 DIAGNOSIS — B20 Human immunodeficiency virus [HIV] disease: Secondary | ICD-10-CM

## 2024-11-19 DIAGNOSIS — Z113 Encounter for screening for infections with a predominantly sexual mode of transmission: Secondary | ICD-10-CM

## 2024-11-19 MED ORDER — CABOTEGRAVIR & RILPIVIRINE ER 600 & 900 MG/3ML IM SUER
1.0000 | Freq: Once | INTRAMUSCULAR | Status: AC
  Administered 2024-11-19: 1 via INTRAMUSCULAR

## 2024-11-19 NOTE — Progress Notes (Signed)
 "  HPI: Justin [name] is a 44 y.o. male who presents to the [facility] pharmacy clinic for Cabenuva  administration.  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   DOXYCYCLINE  (ADOXA) 100 MG TABLET    Take 100 mg by mouth as needed (doxy PEP).   GLUCOSAMINE HCL (GLUCOSAMINE PO)    Take by mouth.   MULTIPLE VITAMIN (MULTIVITAMIN PO)    Take by mouth.   PRAZOSIN (MINIPRESS) 1 MG CAPSULE    Take 1 mg by mouth at bedtime.   TESTOSTERONE CYPIONATE (DEPOTESTOSTERONE CYPIONATE) 200 MG/ML INJECTION    Inject 200 mg into the muscle once a week.   VALACYCLOVIR (VALTREX) 500 MG TABLET    Take 500 mg by mouth daily.   VENLAFAXINE  XR (EFFEXOR -XR) 150 MG 24 HR CAPSULE    TAKE 1 CAPSULE BY MOUTH EVERY DAY   ZOSTER VACCINE ADJUVANTED (SHINGRIX) INJECTION    Inject 0.5 mL into the deltoid once, then administer second dose 2-6 months later.  Modified Medications   No medications on file  Discontinued Medications   ATORVASTATIN  (LIPITOR) 10 MG TABLET    Take 1 tablet (10 mg total) by mouth daily.    Allergies: Allergies[1]  Past Medical History: Past Medical History:  Diagnosis Date   Colitis    HIV (human immunodeficiency virus  infection) (HCC)    HIV (human immunodeficiency virus infection) (HCC)    Hyperlipidemia    Preproliferative diabetic retinopathy (HCC)     Social History: Social History   Socioeconomic History   Marital status: Married    Spouse name: Not on file   Number of children: 0   Years of education: Not on file   Highest education level: Not on file  Occupational History   Not on file  Tobacco Use   Smoking status: Every Day    Types: E-cigarettes   Smokeless tobacco: Never  Vaping Use   Vaping status: Every Day   Substances: Nicotine  Substance and Sexual Activity   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   Sexual activity: Yes    Partners: Male    Birth control/protection: None  Other Topics Concern   Not on file  Social History Narrative   Not on file   Social Drivers of Health   Tobacco Use: Low Risk  (10/10/2024)   Received from [facility]   Patient History    Smoking Tobacco Use: Never    Smokeless Tobacco Use: Never    Passive Exposure: Not on file  Recent Concern: Tobacco Use - High Risk (07/16/2024)   Patient History  Smoking Tobacco Use: Every Day    Smokeless Tobacco Use: Never    Passive Exposure: Not on file  Financial Resource Strain: Not on file  Food Insecurity: Not on file  Transportation Needs: Not on file  Physical Activity: Not on file  Stress: Not on file (08/28/2023)  Social Connections: Not on file  Depression (PHQ2-9): High Risk (07/16/2024)   Depression (PHQ2-9)    PHQ-2 Score: 14  Alcohol Screen: Not on file  Housing: Not on file  Utilities: Not on file  Health Literacy: Not on file    Labs: Lab Results  Component Value Date   HIV1RNAQUANT NOT DETECTED 05/20/2024   HIV1RNAQUANT NOT DETECTED 03/27/2024   HIV1RNAQUANT Not Detected 11/14/2023   CD4TABS 931 05/20/2024   CD4TABS 827 01/23/2024   CD4TABS 1,440 03/07/2023    RPR and  STI Lab Results  Component Value Date   LABRPR REACTIVE (A) 09/18/2024   LABRPR REACTIVE (A) 05/20/2024   LABRPR REACTIVE (A) 03/27/2024   LABRPR REACTIVE (A) 01/23/2024   LABRPR REACTIVE (A) 11/14/2023   RPRTITER 1:4 (H) 09/18/2024   RPRTITER 1:4 (H) 05/20/2024   RPRTITER 1:4 (H) 03/27/2024   RPRTITER 1:2 (H) 01/23/2024   RPRTITER 1:4 (H) 11/14/2023    STI Results GC CT  05/20/2024 11:17 AM Negative    Negative  Negative    Negative   05/20/2024 11:14 AM Negative  Negative   03/27/2024  9:56 AM Negative    Negative    Negative  Negative    Negative    Negative   01/23/2024  3:10 PM Negative    Negative    Negative  Negative    Negative    Negative   11/14/2023 10:20 AM Negative    Negative    Negative  Negative    Negative    Negative   09/20/2023  9:46 AM Negative    Negative    Negative  Negative    Negative    Negative   07/17/2023 11:30 AM Negative    Negative  Negative    Negative   07/17/2023 11:29 AM Negative  Negative   03/01/2023  1:53 PM Negative  Negative   03/01/2023  1:51 PM Negative  Negative   03/01/2023  1:42 PM Negative  Negative   06/10/2022  3:14 PM Negative    Negative  Negative    Negative   09/15/2021 11:20 AM Negative  Negative   07/12/2021  3:41 PM Negative    Negative    Negative  Negative    Negative    Negative   04/14/2021  3:52 PM Negative  Negative   01/26/2021  2:32 PM Negative  Negative   04/11/2020 11:04 AM Negative  Negative     Hepatitis B Lab Results  Component Value Date   HEPBSAB REACTIVE (A) 04/14/2021   HEPBSAG NON-REACTIVE 04/14/2021   HEPBCAB NON-REACTIVE 04/14/2021   Hepatitis C Lab Results  Component Value Date   HEPCAB REACTIVE (A) 04/14/2021   HCVRNAPCRQN <15 NOT DETECTED 04/14/2021   Hepatitis A Lab Results  Component Value Date   HAV REACTIVE (A) 04/14/2021   Lipids: Lab Results  Component Value Date   CHOL 193 11/14/2023   TRIG 182 (H) 11/14/2023   HDL 38 (L) 11/14/2023   CHOLHDL 5.1 (H)  11/14/2023   LDLCALC 125 (H) 11/14/2023    TARGET DATE:  The 27th of the month  Assessment: Justin [name] presents today for their maintenance Cabenuva  injections. Initial/past injections were tolerated well without  issues. No problems with systemic effects of injections.   Administered cabotegravir  600mg /53mL in left upper outer quadrant of the gluteal muscle. Administered rilpivirine  900 mg/3mL in the right upper outer quadrant of the gluteal muscle. Monitored patient for 10 minutes after injection. Injections were tolerated well without issue. Patient will follow up in 2 months for next injection. Will check HIV RNA today along with annual maintenance labs. Accepts full STI testing today aside from RPR since was checked with PCP last week and resulted 1:8 (only 2-fold increase from serofast status of 1:4 over the last year). Currently up to date on all recommended vaccines aside from Shingles which prescription was provided previously.   Plan: - Administer Cabenuva  injections  - Check HIV RNA, CBC, CMP, lipid panel, and urine/oral/rectal cytologies - Next injections scheduled for 3/27 with Corean and 5/20 with me  - Call with any issues or questions  Alan Geralds, PharmD, CPP, BCIDP, AAHIVP Clinical Pharmacist Practitioner Infectious Diseases Clinical Pharmacist [facility]      [1] No Known Allergies  "

## 2024-11-20 LAB — GC/CHLAMYDIA PROBE, AMP (THROAT)
Chlamydia trachomatis RNA: NOT DETECTED
Neisseria gonorrhoeae RNA: NOT DETECTED

## 2024-11-20 LAB — CT/NG RNA, TMA RECTAL
Chlamydia Trachomatis RNA: NOT DETECTED
Neisseria Gonorrhoeae RNA: NOT DETECTED

## 2024-11-21 LAB — LIPID PANEL
Cholesterol: 174 mg/dL
HDL: 29 mg/dL — ABNORMAL LOW
LDL Cholesterol (Calc): 126 mg/dL — ABNORMAL HIGH
Non-HDL Cholesterol (Calc): 145 mg/dL — ABNORMAL HIGH
Total CHOL/HDL Ratio: 6 (calc) — ABNORMAL HIGH
Triglycerides: 86 mg/dL

## 2024-11-21 LAB — COMPREHENSIVE METABOLIC PANEL WITH GFR
AG Ratio: 1.4 (calc) (ref 1.0–2.5)
ALT: 17 U/L (ref 9–46)
AST: 16 U/L (ref 10–40)
Albumin: 4.9 g/dL (ref 3.6–5.1)
Alkaline phosphatase (APISO): 48 U/L (ref 36–130)
BUN: 14 mg/dL (ref 7–25)
CO2: 30 mmol/L (ref 20–32)
Calcium: 10 mg/dL (ref 8.6–10.3)
Chloride: 100 mmol/L (ref 98–110)
Creat: 1.17 mg/dL (ref 0.60–1.29)
Globulin: 3.4 g/dL (ref 1.9–3.7)
Glucose, Bld: 92 mg/dL (ref 65–99)
Potassium: 4.2 mmol/L (ref 3.5–5.3)
Sodium: 139 mmol/L (ref 135–146)
Total Bilirubin: 0.4 mg/dL (ref 0.2–1.2)
Total Protein: 8.3 g/dL — ABNORMAL HIGH (ref 6.1–8.1)
eGFR: 80 mL/min/{1.73_m2}

## 2024-11-21 LAB — CBC
HCT: 55.5 % — ABNORMAL HIGH (ref 39.4–51.1)
Hemoglobin: 16.3 g/dL (ref 13.2–17.1)
MCH: 22.2 pg — ABNORMAL LOW (ref 27.0–33.0)
MCHC: 29.4 g/dL — ABNORMAL LOW (ref 31.6–35.4)
MCV: 75.5 fL — ABNORMAL LOW (ref 81.4–101.7)
Platelets: 259 10*3/uL (ref 140–400)
RBC: 7.35 Million/uL — ABNORMAL HIGH (ref 4.20–5.80)
RDW: 16.9 % — ABNORMAL HIGH (ref 11.0–15.0)
WBC: 8.2 10*3/uL (ref 3.8–10.8)

## 2024-11-21 LAB — C. TRACHOMATIS/N. GONORRHOEAE RNA
C. trachomatis RNA, TMA: NOT DETECTED
N. gonorrhoeae RNA, TMA: NOT DETECTED

## 2024-11-21 LAB — HIV-1 RNA QUANT-NO REFLEX-BLD
HIV 1 RNA Quant: NOT DETECTED {copies}/mL
HIV-1 RNA Quant, Log: NOT DETECTED {Log_copies}/mL

## 2025-01-14 ENCOUNTER — Ambulatory Visit: Admitting: Pharmacist

## 2025-01-17 ENCOUNTER — Encounter: Payer: Self-pay | Admitting: Infectious Diseases

## 2025-03-12 ENCOUNTER — Ambulatory Visit: Admitting: Pharmacist
